# Patient Record
Sex: Male | Born: 1935 | Race: White | Hispanic: No | Marital: Married | Smoking: Former smoker
Health system: Southern US, Community
[De-identification: ages and names within clinical notes are randomized; demographics above are authoritative.]

## PROBLEM LIST (undated history)

## (undated) DIAGNOSIS — Z951 Presence of aortocoronary bypass graft: Secondary | ICD-10-CM

## (undated) DIAGNOSIS — D494 Neoplasm of unspecified behavior of bladder: Secondary | ICD-10-CM

## (undated) DIAGNOSIS — E785 Hyperlipidemia, unspecified: Secondary | ICD-10-CM

## (undated) DIAGNOSIS — I10 Essential (primary) hypertension: Secondary | ICD-10-CM

## (undated) DIAGNOSIS — I2510 Atherosclerotic heart disease of native coronary artery without angina pectoris: Secondary | ICD-10-CM

## (undated) HISTORY — PX: OTHER SURGICAL HISTORY: SHX169

---

## 1983-09-21 HISTORY — PX: CHOLECYSTECTOMY: SHX55

## 2000-01-07 ENCOUNTER — Encounter: Payer: Self-pay | Admitting: Urology

## 2000-01-07 ENCOUNTER — Encounter (INDEPENDENT_AMBULATORY_CARE_PROVIDER_SITE_OTHER): Payer: Self-pay | Admitting: Specialist

## 2000-01-07 ENCOUNTER — Ambulatory Visit (HOSPITAL_COMMUNITY): Admission: RE | Admit: 2000-01-07 | Discharge: 2000-01-07 | Payer: Self-pay | Admitting: Urology

## 2000-11-25 ENCOUNTER — Encounter (INDEPENDENT_AMBULATORY_CARE_PROVIDER_SITE_OTHER): Payer: Self-pay | Admitting: Specialist

## 2000-11-25 ENCOUNTER — Observation Stay (HOSPITAL_COMMUNITY): Admission: RE | Admit: 2000-11-25 | Discharge: 2000-11-26 | Payer: Self-pay | Admitting: Urology

## 2002-09-20 HISTORY — PX: CORONARY ARTERY BYPASS GRAFT: SHX141

## 2007-05-01 ENCOUNTER — Ambulatory Visit (HOSPITAL_BASED_OUTPATIENT_CLINIC_OR_DEPARTMENT_OTHER): Admission: RE | Admit: 2007-05-01 | Discharge: 2007-05-01 | Payer: Self-pay | Admitting: Urology

## 2007-05-01 ENCOUNTER — Encounter (INDEPENDENT_AMBULATORY_CARE_PROVIDER_SITE_OTHER): Payer: Self-pay | Admitting: Urology

## 2008-10-11 ENCOUNTER — Ambulatory Visit (HOSPITAL_BASED_OUTPATIENT_CLINIC_OR_DEPARTMENT_OTHER): Admission: RE | Admit: 2008-10-11 | Discharge: 2008-10-11 | Payer: Self-pay | Admitting: Urology

## 2008-10-11 ENCOUNTER — Encounter (INDEPENDENT_AMBULATORY_CARE_PROVIDER_SITE_OTHER): Payer: Self-pay | Admitting: Urology

## 2009-06-11 ENCOUNTER — Ambulatory Visit (HOSPITAL_COMMUNITY): Admission: RE | Admit: 2009-06-11 | Discharge: 2009-06-11 | Payer: Self-pay | Admitting: Urology

## 2009-11-27 ENCOUNTER — Ambulatory Visit (HOSPITAL_BASED_OUTPATIENT_CLINIC_OR_DEPARTMENT_OTHER): Admission: RE | Admit: 2009-11-27 | Discharge: 2009-11-28 | Payer: Self-pay | Admitting: Urology

## 2010-12-11 LAB — POCT I-STAT 4, (NA,K, GLUC, HGB,HCT)
Glucose, Bld: 107 mg/dL — ABNORMAL HIGH (ref 70–99)
HCT: 47 % (ref 39.0–52.0)
Hemoglobin: 16 g/dL (ref 13.0–17.0)
Potassium: 5.1 mEq/L (ref 3.5–5.1)
Sodium: 138 mEq/L (ref 135–145)

## 2011-01-04 LAB — POCT I-STAT 4, (NA,K, GLUC, HGB,HCT)
Glucose, Bld: 88 mg/dL (ref 70–99)
HCT: 45 % (ref 39.0–52.0)
Hemoglobin: 15.3 g/dL (ref 13.0–17.0)
Potassium: 4.6 mEq/L (ref 3.5–5.1)
Sodium: 140 mEq/L (ref 135–145)

## 2011-02-02 NOTE — Op Note (Signed)
NAME:  ELAI, Jose Gaines             ACCOUNT NO.:  [id number]   MEDICAL RECORD NO.:  [id number]          PATIENT TYPE:  AMB   LOCATION:  [facility]                         FACILITY:  [facility]   PHYSICIAN:  Sigmund Gaines. Patsi Sears, M.D.DATE OF BIRTH:  24-Apr-1927   DATE OF PROCEDURE:  10/11/2008  DATE OF DISCHARGE:                               OPERATIVE REPORT   PREOPERATIVE DIAGNOSIS:  Recurrent right bladder wall bladder cancer.   POSTOPERATIVE DIAGNOSIS:  Recurrent right bladder wall bladder cancer.   OPERATION:  Cystourethroscopy, cold cup bladder biopsy, holmium laser  vaporization of bladder tumor site, installation of mitomycin C.   SURGEON:  Sigmund Gaines. Patsi Sears, M.D.   ANESTHESIA:  General LMA.   PREPARATION:  After appropriate preanesthesia, the patient was brought  to the operating room, placed on the operating room table in the dorsal  supine position where general LMA anesthesia was introduced.  He was  then replaced in dorsal lithotomy position where the pubis was prepped  with Betadine solution and draped in the usual fashion.   REVIEW OF HISTORY:  Mr. [name] is a 77 year old male, history of  recurrent superficial bladder cancers.  He has been treated with BCG, as  well as mitomycin instillation in the past.  The patient also takes  multivitamins.  The patient has had a recent cystoscopy showing  recurrent bladder cancer.  He now is to undergo bladder biopsy and the  holmium laser vaporization of bladder tumor.   PROCEDURE:  Cystourethroscopy was accomplished, and shows a normal-  appearing bladder, except for a solitary right posterior bladder wall  bladder cancer.  There were multiple areas of whitish scar, representing  of previous areas of bladder cancer treatment.  There is no other  bladder cancer identified.  The prostate was subocclusive, with bilobar  BPH only.  The vera was normal.  There was no evidence of bladder stone  or diverticular formation.  There was  clear efflux of both orifices on a  normal trigone.  Cold cup bladder biopsy was then obtained, and sent to  laboratory for evaluation.  Holmium laser was used to paint the  remaining bladder cancer and its biopsy site.  The patient will undergo  installation of mitomycin C for 30-45 minutes postoperatively.  He was  awakened and given IV Toradol.  He had B  and O suppository at the  beginning of the procedure.  He was taken to the recovery room in good  condition.      Sigmund Gaines. Patsi Sears, M.D.  Electronically Signed     SIT/MEDQ  D:  10/11/2008  T:  10/11/2008  Job:  [id number]

## 2011-02-02 NOTE — Op Note (Signed)
NAMEDNAIEL, VOLLER             ACCOUNT NO.:  [id number]   MEDICAL RECORD NO.:  [id number]          PATIENT TYPE:  AMB   LOCATION:  [facility]                         FACILITY:  [facility]   PHYSICIAN:  Sigmund I. Patsi Sears, M.D.DATE OF BIRTH:  05-03-1927   DATE OF PROCEDURE:  05/01/2007  DATE OF DISCHARGE:  05/01/2007                               OPERATIVE REPORT   PREOPERATIVE DIAGNOSIS:  Recurrent left bladder wall bladder cancer.   POSTOPERATIVE DIAGNOSIS:  Recurrent left bladder wall bladder cancer.   OPERATION:  Cystourethroscopy, TUR left bladder wall bladder tumor,  random cold-cut bladder biopsy.   SURGEON:  Sigmund I. Patsi Sears, MD.   ANESTHESIA:  General LMA.   PREPARATION:  After appropriate pre-anesthesia, the patient was brought  to the operating room and placed on the operating table in the dorsal  supine position where general LMA anesthesia was introduced.  He was  then replaced in the dorsal lithotomy position where the pubis was  prepped with Betadine solution and draped in a the usual fashion.   PROCEDURE:  Cystourethroscopy was accomplished, and the left lateral  bladder wall bladder cancer was identified.  This was removed with cold-  cut bladder biopsy forceps, and the base cauterized.  Remaining bladder  biopsies were obtained randomly from around the bladder.  These areas  were cauterized as well.  The bladder was irrigated free of fluid, and  the patient was awakened and taken to the recovery room in good  condition.      Sigmund I. Patsi Sears, M.D.  Electronically Signed     SIT/MEDQ  D:  05/26/2007  T:  05/26/2007  Job:  [id number]

## 2011-02-02 NOTE — Op Note (Signed)
Jose Gaines, Jose Gaines             ACCOUNT NO.:  [id number]   MEDICAL RECORD NO.:  [id number]          PATIENT TYPE:  AMB   LOCATION:  [facility]                         FACILITY:  [facility]   PHYSICIAN:  Sigmund I. Patsi Sears, M.D.DATE OF BIRTH:  [birth year]   DATE OF PROCEDURE:  05/01/2007  DATE OF DISCHARGE:                               OPERATIVE REPORT   PREOPERATIVE DIAGNOSIS:  Recurrence of left bladder wall bladder cancer,  post BCG.   POSTOPERATIVE DIAGNOSIS:  Recurrence of left bladder wall bladder  cancer, post BCG.   OPERATION:  Cystuorethroscopy, transurethral cold cup biopsy of  recurrent bladder wall bladder tumor, random bladder biopsy,  cauterization of biopsy sites, and cauterization of bladder wall tumor,  Foley catheterization with insulation of chemotherapeutic agent  (mitomycin C).   SURGEON:  Sigmund I. Patsi Sears, M.D.   ANESTHESIA:  General LMA.   PREPARATION:  After preoperative preanesthesia, the patient was brought  to the operating room and placed on the operating table in the dorsal  supine position.  General LMA anesthesia was used.  He was then replaced  in the dorsal lithotomy position, where the penis was prepped with  Betadine solution and draped in the usual fashion.   REVIEW OF HISTORY:  [name] is a 77 year old male with a history of  multiple recurrent bladder cancers, status post BCG therapy over many  years.  He now has recurrence of his bladder wall bladder cancer.  No  history of upper tract disease.   PROCEDURE:  Cystourethroscopy reveals a left bladder wall bladder tumor,  appearing as moss growing on the left side of the bladder wall.  Cold  cup bladder biopsies are taken, as well as random bladder biopsies from  the right side of the bladder.  Clear efflux is seen from both orifices.  Cauterization is accomplished of the remaining tumor, as well as the  other biopsy site.  Foley catheter is placed.  Mitomycin 40 mg in 40 cc  of water  is placed for 30 minutes, and then the bladder will be  irrigated with 250 cc of normal saline in the recovery room.  Because of  the patient's past history of difficulty voiding after TUR of bladder  tumors, we will leave the Foley catheter overnight, place the patient on  Flomax, and then remove the catheter at home tomorrow morning.  He is  awakened and taken to the recovery room in good condition.  He is given  30 mg of IV Toradol.      Sigmund I. Patsi Sears, M.D.  Electronically Signed     SIT/MEDQ  D:  05/01/2007  T:  05/02/2007  Job:  [id number]

## 2011-02-02 NOTE — Op Note (Signed)
NAMEGABRIAN, HOQUE             ACCOUNT NO.:  [id number]   MEDICAL RECORD NO.:  [id number]          PATIENT TYPE:  AMB   LOCATION:  [facility]                         FACILITY:  [facility]   PHYSICIAN:  Sigmund I. Patsi Sears, M.D.DATE OF BIRTH:  Oct 10, 1926   DATE OF PROCEDURE:  DATE OF DISCHARGE:                               OPERATIVE REPORT   Re-dictated, 05/26/07.  # [id number].  ST      Sigmund I. Patsi Sears, M.D.  Electronically Signed     SIT/MEDQ  D:  05/01/2007  T:  05/02/2007  Job:  [id number]

## 2011-02-05 NOTE — Op Note (Signed)
[facility]  Patient:    Jose Gaines, PLAIN                    MRN: [id number] Proc. Date: 11/25/00 Adm. Date:  [id number] Attending:  Laqueta Jean                           Operative Report  PREOPERATIVE DIAGNOSES:  Recurrent bladder tumor.  POSTOPERATIVE DIAGNOSES:  Recurrent bladder tumor.  OPERATION PERFORMED:  Cystourethroscopy, transurethral resection bladder tumor (1 oclock) and cold cut bladder biopsies .  SURGEON:  Dr. Patsi Sears.  ANESTHESIA:  General (LMA).  PREPARATION:  After appropriate preanesthesia, the patient was brought to the operating room, placed on the operating table in dorsal supine position where general LMA anesthesia was introduced. He was then replaced in the dorsal lithotomy position where the pubis was prepped with Betadine solution and draped in the usual fashion.  DESCRIPTION OF PROCEDURE:  Cystourethroscopy revealed abnormality at the 1 oclock position, and this is transurethrally resected and sent separately for examination. Cold cut bladder biopsies of erythematous patches are also made, although these are thought secondary to old BCG therapy. Cauterization was accomplished of all areas of biopsies, and no bleeding was noted. A 20 Foley catheter was placed with ______ using a balloon, and the patient was then awakened and taken to the recovery room after B&O suppository was given. IV Toradol was given as well as Zofran. The patient was taken to the recovery room in good condition. DD:  11/25/00 TD:  11/26/00 Job: [id number]

## 2011-07-05 LAB — POCT I-STAT 4, (NA,K, GLUC, HGB,HCT)
Glucose, Bld: 91
HCT: 48
Hemoglobin: 16.3
Operator id: 114531
Potassium: 4.9
Sodium: 136

## 2012-01-12 ENCOUNTER — Other Ambulatory Visit: Payer: Self-pay | Admitting: Urology

## 2012-03-03 ENCOUNTER — Encounter (HOSPITAL_BASED_OUTPATIENT_CLINIC_OR_DEPARTMENT_OTHER): Payer: Self-pay | Admitting: *Deleted

## 2012-03-03 NOTE — Progress Notes (Signed)
NPO AFTER MN. ARRIVES AT 0715. NEEDS ISTAT AND EKG. WILL TAKE LIPITOR AM OF SURG. W/ SIP OF WATER.

## 2012-03-09 ENCOUNTER — Other Ambulatory Visit: Payer: Self-pay

## 2012-03-09 ENCOUNTER — Encounter (HOSPITAL_BASED_OUTPATIENT_CLINIC_OR_DEPARTMENT_OTHER): Payer: Self-pay | Admitting: Anesthesiology

## 2012-03-09 ENCOUNTER — Ambulatory Visit (HOSPITAL_BASED_OUTPATIENT_CLINIC_OR_DEPARTMENT_OTHER)
Admission: RE | Admit: 2012-03-09 | Discharge: 2012-03-09 | Disposition: A | Discharge status: Home / Self Care | Payer: Medicare Other | Source: Ambulatory Visit | Attending: Urology | Admitting: Urology

## 2012-03-09 ENCOUNTER — Encounter (HOSPITAL_BASED_OUTPATIENT_CLINIC_OR_DEPARTMENT_OTHER)
Admission: RE | Disposition: A | Discharge status: Home / Self Care | Payer: Self-pay | Source: Ambulatory Visit | Attending: Urology

## 2012-03-09 ENCOUNTER — Encounter (HOSPITAL_BASED_OUTPATIENT_CLINIC_OR_DEPARTMENT_OTHER): Payer: Self-pay | Admitting: *Deleted

## 2012-03-09 ENCOUNTER — Ambulatory Visit (HOSPITAL_BASED_OUTPATIENT_CLINIC_OR_DEPARTMENT_OTHER): Payer: Medicare Other | Admitting: Anesthesiology

## 2012-03-09 DIAGNOSIS — C679 Malignant neoplasm of bladder, unspecified: Secondary | ICD-10-CM

## 2012-03-09 DIAGNOSIS — Z7982 Long term (current) use of aspirin: Secondary | ICD-10-CM | POA: Insufficient documentation

## 2012-03-09 DIAGNOSIS — I10 Essential (primary) hypertension: Secondary | ICD-10-CM | POA: Insufficient documentation

## 2012-03-09 DIAGNOSIS — N3020 Other chronic cystitis without hematuria: Secondary | ICD-10-CM | POA: Insufficient documentation

## 2012-03-09 DIAGNOSIS — I2510 Atherosclerotic heart disease of native coronary artery without angina pectoris: Secondary | ICD-10-CM | POA: Insufficient documentation

## 2012-03-09 DIAGNOSIS — D303 Benign neoplasm of bladder: Secondary | ICD-10-CM | POA: Insufficient documentation

## 2012-03-09 DIAGNOSIS — E785 Hyperlipidemia, unspecified: Secondary | ICD-10-CM | POA: Insufficient documentation

## 2012-03-09 DIAGNOSIS — Z951 Presence of aortocoronary bypass graft: Secondary | ICD-10-CM | POA: Insufficient documentation

## 2012-03-09 DIAGNOSIS — Z79899 Other long term (current) drug therapy: Secondary | ICD-10-CM | POA: Insufficient documentation

## 2012-03-09 HISTORY — DX: Presence of aortocoronary bypass graft: Z95.1

## 2012-03-09 HISTORY — DX: Hyperlipidemia, unspecified: E78.5

## 2012-03-09 HISTORY — DX: Neoplasm of unspecified behavior of bladder: D49.4

## 2012-03-09 HISTORY — DX: Atherosclerotic heart disease of native coronary artery without angina pectoris: I25.10

## 2012-03-09 HISTORY — DX: Essential (primary) hypertension: I10

## 2012-03-09 HISTORY — PX: TRANSURETHRAL RESECTION OF BLADDER TUMOR: SHX2575

## 2012-03-09 LAB — POCT I-STAT 4, (NA,K, GLUC, HGB,HCT)
Glucose, Bld: 95 mg/dL (ref 70–99)
HCT: 43 % (ref 39.0–52.0)
Hemoglobin: 14.6 g/dL (ref 13.0–17.0)
Potassium: 4.2 mEq/L (ref 3.5–5.1)
Sodium: 142 mEq/L (ref 135–145)

## 2012-03-09 SURGERY — TURBT (TRANSURETHRAL RESECTION OF BLADDER TUMOR)
Anesthesia: General | Site: Bladder | Wound class: Clean Contaminated

## 2012-03-09 MED ORDER — LIDOCAINE HCL 2 % EX GEL
CUTANEOUS | Status: DC | PRN
  Administered 2012-03-09: 1

## 2012-03-09 MED ORDER — PROPOFOL 10 MG/ML IV EMUL
INTRAVENOUS | Status: DC | PRN
  Administered 2012-03-09: 200 mg via INTRAVENOUS

## 2012-03-09 MED ORDER — SODIUM CHLORIDE 0.45 % IV SOLN: INTRAVENOUS | Status: DC

## 2012-03-09 MED ORDER — HYDROCODONE-ACETAMINOPHEN 7.5-650 MG PO TABS: 1.0000 | ORAL_TABLET | Freq: Four times a day (QID) | ORAL | Status: AC | PRN

## 2012-03-09 MED ORDER — SODIUM CHLORIDE 0.9 % IR SOLN
Status: DC | PRN
  Administered 2012-03-09: 6000 mL via INTRAVESICAL

## 2012-03-09 MED ORDER — SODIUM CHLORIDE 0.9 % IV SOLN: 250.0000 mL | INTRAVENOUS | Status: DC | PRN

## 2012-03-09 MED ORDER — ONDANSETRON HCL 4 MG/2ML IJ SOLN
INTRAMUSCULAR | Status: DC | PRN
  Administered 2012-03-09: 4 mg via INTRAVENOUS

## 2012-03-09 MED ORDER — LACTATED RINGERS IV SOLN
INTRAVENOUS | Status: DC
  Administered 2012-03-09 (×2): via INTRAVENOUS

## 2012-03-09 MED ORDER — LIDOCAINE HCL (CARDIAC) 20 MG/ML IV SOLN
INTRAVENOUS | Status: DC | PRN
  Administered 2012-03-09: 75 mg via INTRAVENOUS

## 2012-03-09 MED ORDER — PHENAZOPYRIDINE HCL 200 MG PO TABS: 200.0000 mg | ORAL_TABLET | Freq: Three times a day (TID) | ORAL | Status: AC | PRN

## 2012-03-09 MED ORDER — HYOSCYAMINE SULFATE 0.125 MG SL SUBL
0.1250 mg | SUBLINGUAL_TABLET | SUBLINGUAL | Status: DC | PRN
  Administered 2012-03-09: 0.125 mg via SUBLINGUAL

## 2012-03-09 MED ORDER — ACETAMINOPHEN 325 MG PO TABS: 650.0000 mg | ORAL_TABLET | ORAL | Status: DC | PRN

## 2012-03-09 MED ORDER — BELLADONNA ALKALOIDS-OPIUM 16.2-60 MG RE SUPP
RECTAL | Status: DC | PRN
  Administered 2012-03-09: 1 via RECTAL

## 2012-03-09 MED ORDER — DEXAMETHASONE SODIUM PHOSPHATE 4 MG/ML IJ SOLN
INTRAMUSCULAR | Status: DC | PRN
  Administered 2012-03-09: 10 mg via INTRAVENOUS

## 2012-03-09 MED ORDER — ACETAMINOPHEN 10 MG/ML IV SOLN
INTRAVENOUS | Status: DC | PRN
  Administered 2012-03-09: 1000 mg via INTRAVENOUS

## 2012-03-09 MED ORDER — FENTANYL CITRATE 0.05 MG/ML IJ SOLN
INTRAMUSCULAR | Status: DC | PRN
  Administered 2012-03-09: 25 ug via INTRAVENOUS
  Administered 2012-03-09: 50 ug via INTRAVENOUS
  Administered 2012-03-09 (×3): 25 ug via INTRAVENOUS

## 2012-03-09 MED ORDER — ONDANSETRON HCL 4 MG/2ML IJ SOLN: 4.0000 mg | Freq: Four times a day (QID) | INTRAMUSCULAR | Status: DC | PRN

## 2012-03-09 MED ORDER — PHENAZOPYRIDINE HCL 200 MG PO TABS
200.0000 mg | ORAL_TABLET | Freq: Three times a day (TID) | ORAL | Status: DC
  Administered 2012-03-09: 200 mg via ORAL

## 2012-03-09 MED ORDER — FENTANYL CITRATE 0.05 MG/ML IJ SOLN
25.0000 ug | INTRAMUSCULAR | Status: DC | PRN
  Administered 2012-03-09 (×4): 25 ug via INTRAVENOUS

## 2012-03-09 MED ORDER — SODIUM CHLORIDE 0.9 % IJ SOLN: 3.0000 mL | Freq: Two times a day (BID) | INTRAMUSCULAR | Status: DC

## 2012-03-09 MED ORDER — CEFAZOLIN SODIUM 1-5 GM-% IV SOLN
1.0000 g | INTRAVENOUS | Status: AC
  Administered 2012-03-09: 2 g via INTRAVENOUS

## 2012-03-09 MED ORDER — ACETAMINOPHEN 650 MG RE SUPP: 650.0000 mg | RECTAL | Status: DC | PRN

## 2012-03-09 MED ORDER — EPHEDRINE SULFATE 50 MG/ML IJ SOLN
INTRAMUSCULAR | Status: DC | PRN
  Administered 2012-03-09: 10 mg via INTRAVENOUS

## 2012-03-09 MED ORDER — OXYCODONE HCL 5 MG PO TABS
5.0000 mg | ORAL_TABLET | ORAL | Status: DC | PRN
  Administered 2012-03-09: 10 mg via ORAL

## 2012-03-09 MED ORDER — SODIUM CHLORIDE 0.9 % IJ SOLN: 3.0000 mL | INTRAMUSCULAR | Status: DC | PRN

## 2012-03-09 MED ORDER — ASPIRIN 81 MG PO TABS
81.0000 mg | ORAL_TABLET | Freq: Every day | ORAL | Status: AC
Start: 2012-03-09 — End: ?

## 2012-03-09 MED ORDER — HYOSCYAMINE SULFATE CR 0.375 MG PO CP12: 0.3750 mg | ORAL_CAPSULE | Freq: Two times a day (BID) | ORAL | Status: DC | PRN

## 2012-03-09 MED ORDER — GLYCOPYRROLATE 0.2 MG/ML IJ SOLN
INTRAMUSCULAR | Status: DC | PRN
  Administered 2012-03-09: 0.2 mg via INTRAVENOUS

## 2012-03-09 SURGICAL SUPPLY — 29 items
BAG DRAIN URO-CYSTO SKYTR STRL (DRAIN) ×2 IMPLANT
BAG DRN ANRFLXCHMBR STRAP LEK (BAG)
BAG URINE DRAINAGE (UROLOGICAL SUPPLIES) IMPLANT
BAG URINE LEG 19OZ MD ST LTX (BAG) IMPLANT
BOOTIES KNEE HIGH SLOAN (MISCELLANEOUS) ×2 IMPLANT
CANISTER SUCT LVC 12 LTR MEDI- (MISCELLANEOUS) IMPLANT
CATH FOLEY 2WAY SLVR  5CC 20FR (CATHETERS)
CATH FOLEY 2WAY SLVR  5CC 22FR (CATHETERS)
CATH FOLEY 2WAY SLVR 5CC 20FR (CATHETERS) IMPLANT
CATH FOLEY 2WAY SLVR 5CC 22FR (CATHETERS) IMPLANT
CLOTH BEACON ORANGE TIMEOUT ST (SAFETY) ×2 IMPLANT
DRAPE CAMERA CLOSED 9X96 (DRAPES) ×2 IMPLANT
ELECT LOOP HF 24-28F (CUTTING LOOP) IMPLANT
ELECT LOOP HF 26F 30D .35MM (CUTTING LOOP) IMPLANT
ELECT REM PT RETURN 9FT ADLT (ELECTROSURGICAL) ×2
ELECTRODE REM PT RTRN 9FT ADLT (ELECTROSURGICAL) ×1 IMPLANT
GLOVE BIO SURGEON STRL SZ7 (GLOVE) ×2 IMPLANT
GLOVE BIO SURGEON STRL SZ7.5 (GLOVE) ×4 IMPLANT
GLOVE ECLIPSE 6.5 STRL STRAW (GLOVE) ×2 IMPLANT
GOWN STRL REIN XL XLG (GOWN DISPOSABLE) ×2 IMPLANT
GOWN SURGICAL LARGE (GOWNS) ×2 IMPLANT
GOWN XL W/COTTON TOWEL STD (GOWNS) ×2 IMPLANT
HOLDER FOLEY CATH W/STRAP (MISCELLANEOUS) IMPLANT
KIT ASPIRATION TUBING (SET/KITS/TRAYS/PACK) IMPLANT
LOOP CUTTING 24FR OLYMPUS (CUTTING LOOP) IMPLANT
PACK CYSTOSCOPY (CUSTOM PROCEDURE TRAY) ×2 IMPLANT
PLUG CATH AND CAP STER (CATHETERS) IMPLANT
SET ASPIRATION TUBING (TUBING) IMPLANT
SYRINGE IRR TOOMEY STRL 70CC (SYRINGE) IMPLANT

## 2012-03-09 NOTE — Interval H&P Note (Signed)
History and Physical Interval Note:  03/09/2012 8:58 AM  Estevan [facility] [employer]  has presented today for surgery, with the diagnosis of BLADDER TUMOR  The various methods of treatment have been discussed with the patient and family. After consideration of risks, benefits and other options for treatment, the patient has consented to  Procedure(s) (LRB): TRANSURETHRAL RESECTION OF BLADDER TUMOR (TURBT) (N/A) as a surgical intervention .  The patient's history has been reviewed, patient examined, no change in status, stable for surgery.  Gaines have reviewed the patients' chart and labs.  Questions were answered to the patient's satisfaction.     Jose Gaines

## 2012-03-09 NOTE — Anesthesia Procedure Notes (Signed)
Procedure Name: LMA Insertion Date/Time: 03/09/2012 8:59 AM Performed by: Fran Lowes Pre-anesthesia Checklist: Patient identified, Emergency Drugs available, Suction available and Patient being monitored Patient Re-evaluated:Patient Re-evaluated prior to inductionOxygen Delivery Method: Circle System Utilized Preoxygenation: Pre-oxygenation with 100% oxygen Intubation Type: IV induction Ventilation: Mask ventilation without difficulty LMA: LMA inserted LMA Size: 4.0 Number of attempts: 1 Airway Equipment and Method: bite block Placement Confirmation: positive ETCO2 Tube secured with: Tape Dental Injury: Teeth and Oropharynx as per pre-operative assessment  Comments: LMA inserted by Dr. Shireen Quan.

## 2012-03-09 NOTE — H&P (Signed)
Urology Admission H&P  Chief Complaint: Recurrent high grade non-invasive TCC bladder  History of Present Illness: 78 yo male post multiple TUR-BT of high-grade, non-invasive bladder tumors, post bladder wash chemotherapy with mitomycin and BCG, now with a solitary recurrence on routine surveillance cystoscopy.   Past Medical History  Diagnosis Date  . Hypertension   . Coronary artery disease CARDIOLOGIST- DR Raeanne Gathers IN [place], [place]    LAST VISIT 6 MONTHS AGO--   . Bladder tumor   . S/P CABG x 4   . Hyperlipidemia    Past Surgical History  Procedure Date  . Cholecystectomy 1985  . Coronary artery bypass graft 2004    X4  . Multiple bladder procedures  since 1975   . Cysto/ bladder bx's/ fulgeration of tumor's X5  LAST ONE 11-27-2009    2 OF THE SURG'S W/ INSTILLATION MITOMYCIN C    Home Medications:  Prescriptions prior to admission  Medication Sig Dispense Refill  . aspirin 81 MG tablet Take 81 mg by mouth daily.      Marland Kitchen atorvastatin (LIPITOR) 40 MG tablet Take 40 mg by mouth every morning.      Jennette Banker Sodium 30-100 MG CAPS Take by mouth daily.      . clopidogrel (PLAVIX) 75 MG tablet Take 75 mg by mouth daily.      Marland Kitchen lisinopril (PRINIVIL,ZESTRIL) 20 MG tablet Take 20 mg by mouth 2 (two) times daily.       Allergies:  Allergies  Allergen Reactions  . Sulfa Antibiotics Swelling and Rash    History reviewed. No pertinent family history. Social History:  reports that he has quit smoking. His smoking use included Pipe and Cigarettes. He quit after 20 years of use. He quit smokeless tobacco use about 33 years ago. He reports that he does not drink alcohol or use illicit drugs.  Review of Systems  Constitutional: Negative.  Negative for fever, chills and weight loss.  HENT: Negative.  Negative for hearing loss.   Eyes: Negative.  Negative for blurred vision.  Respiratory: Negative.   Gastrointestinal: Negative.   Genitourinary: Negative.  Negative for  hematuria.  Musculoskeletal: Negative.   Skin: Negative.  Negative for rash.  Neurological: Negative.  Negative for headaches.  Endo/Heme/Allergies: Negative.   Psychiatric/Behavioral: Negative.     Physical Exam:  Vital signs in last 24 hours: Temp:  [96.9 F (36.1 C)] 96.9 F (36.1 C) (06/20 0732) Pulse Rate:  [48] 48  (06/20 0732) Resp:  [20] 20  (06/20 0732) BP: (182)/(71) 182/71 mmHg (06/20 0732) SpO2:  [94 %] 94 % (06/20 0732) Physical Exam  Constitutional: He is oriented to person, place, and time. He appears well-developed and well-nourished.  HENT:  Head: Normocephalic.  Eyes: Pupils are equal, round, and reactive to light.  Neck: Normal range of motion.  Cardiovascular: Normal rate.   Respiratory: Effort normal.  GI: Soft. He exhibits no distension and no mass. There is no tenderness. There is no rebound and no guarding.  Genitourinary: Penis normal. No penile tenderness.  Musculoskeletal: Normal range of motion.  Neurological: He is alert and oriented to person, place, and time.  Skin: Skin is warm and dry.  Psychiatric: He has a normal mood and affect.    Laboratory Data:  Results for orders placed during the hospital encounter of 03/09/12 (from the past 24 hour(s))  POCT I-STAT 4, (NA,K, GLUC, HGB,HCT)     Status: Normal   Collection Time   03/09/12  7:48 AM  Component Value Range   Sodium 142  135 - 145 mEq/L   Potassium 4.2  3.5 - 5.1 mEq/L   Glucose, Bld 95  70 - 99 mg/dL   HCT 96.0  45.4 - 09.8 %   Hemoglobin 14.6  13.0 - 17.0 g/dL   No results found for this or any previous visit (from the past 240 hour(s)). Creatinine: No results found for this basename: CREATININE:7 in the last 168 hours Baseline Creatinine: 1.5  Impression/Assessment:  Recurrent Bladder tumor for TUR-BT  Plan:  TUR-BT today  Lu Paradise I 03/09/2012, 8:53 AM

## 2012-03-09 NOTE — Discharge Instructions (Signed)
Bladder Cancer Bladder cancer is an abnormal growth of tissue in your bladder. Your bladder is the balloon-like sack in your pelvis. It collects and stores urine that comes from the kidneys through the ureters. The ureters are 2 small muscular tubes that carry the urine from the kidneys to the bladder. When you feel the urge to pass your water (urinate), the urine in the bladder is passed out through the urethra. The bladder wall is made of layers. If cancer spreads into these layers and through the wall of the bladder, it becomes more difficult to treat.  CAUSES  The exact cause of bladder cancer is not known. There is a number of risk factors that can increase your chances of getting bladder cancer. They include:  Smoking. Smokers are more than twice as likely to get bladder cancer as nonsmokers.   Occupational exposures. Workers in a number of industries are at higher risk of bladder cancer. These include:   Rubber, Administrator, textile, and paint production workers.   Painters, hairdressers, Engineer, building services, Journalist, newspaper, and truck drivers.   The combination of industrial exposure and smoking further increases risk.   Race. Whites are about twice as likely to develop bladder cancer than African Americans and Hispanics. Asians have the lowest risk of bladder cancer.   Age. The risk of bladder cancer increases with age. Over half of the people with bladder cancer are over 43 years of age.   Sex. Men are 4 times more likely than women to get bladder cancer.   Chronic bladder inflammation. Urinary infections, kidney, and bladder stones and other causes of chronic bladder irritation are linked with bladder cancer. They do not necessarily cause bladder cancer.   Bladder cancer history. Once you have a bladder cancer, you are at higher risk of having another bladder cancer (even if the first cancer was completely treated).   Passed down by parents (heredity). Some bladder cancers seem to be inherited.    Chemotherapy and radiation therapy:   Cytoprotective medications may be used to protect the bladder from irritation and reduce the risk of bladder cancer.   People who receive radiation treatment to the pelvis are more likely to develop bladder cancer.   Arsenic. Arsenic in drinking water has been associated with an increased risk of bladder cancer.  In certain parts of the world (not the [place] a parasite called schistosomiasis is strongly associated with bladder cancer. This form of bladder cancer is not discussed here.  SYMPTOMS   Blood in the urine (hematuria) is the main symptom of bladder cancer. Blood may be present only in small amounts detectable under a microscope, or may cause the urine to actually appear bloody.   Irritation with urination.   Frequent bladder or urine infections.  DIAGNOSIS  Your caregiver may suspect bladder cancer based on your description of urinary symptomsor based on the finding of blood or infection in the urine (especially if this has recurred several times). A specialist will perform a test by inserting a narrow flexible or rigid tube into your bladder through your penis or urethra (cystoscopy). They will use light anaesthesia during this test. This test searches the lining of your bladder for tumors. A biopsy will be done to sample the tumor. The sample will be examined under a microscope to see if cancer is present.  If a cancer is present, it will then be staged to determine its severity and extent. It is important to know how deeply into the bladder wall the cancer has  grown, and whether the cancer has spread to any other parts of your body. Staging may require blood tests or special scans. TREATMENT  Once your cancer has been diagnosed and staged, you should discuss a treatment plan with your caregiver. Based on the stage of the cancer, one treatment or a combination of treatments may be recommended. The most common forms of treatment are:  Surgery.    Radiation Therapy.   Chemotherapy.   Immunotherapy.  When bladder cancer is caught early, the cancer can often be removed via cystoscopy, and no other treatment is necessary. Ongoing monitoring for recurrence is necessary.  HOME CARE INSTRUCTIONS   Take any prescription medications exactly as directed.   Keep all testing and specialist follow-up appointments.   If you have had a cystoscopy, you may have some blood in your urine after the test. This should clear up. You may also have some irritation with urination. If you were prescribed an antibiotic or pain medication, take it as directed.  SEEK MEDICAL CARE IF:   You develop increased pain, painful urination, or fever after a cystoscopy.   Your urine becomes bloody after initially improving.  SEEK IMMEDIATE MEDICAL CARE IF:  You develop a fever with chills with or without back or flank pain.  Document Released: 09/09/2003 Document Revised: 08/26/2011 Document Reviewed: 04/19/2008 ExitCare Patient Information 2012 [place], [facility]  CYSTOSCOPY HOME CARE INSTRUCTIONS  Activity: Rest for the remainder of the day.  Do not drive or operate equipment today.  You may resume normal activities in one to two days as instructed by your physician.   Meals: Drink plenty of liquids and eat light foods such as gelatin or soup this evening.  You may return to a normal meal plan tomorrow.  Return to Work: You may return to work in one to two days or as instructed by your physician.  Special Instructions / Symptoms: Call your physician if any of these symptoms occur:   -persistent or heavy bleeding  -bleeding which continues after first few urination  -large blood clots that are difficult to pass  -urine stream diminishes or stops completely  -fever equal to or higher than 101 degrees Farenheit.  -cloudy urine with a strong, foul odor  -severe pain  Females should always wipe from front to back after elimination.  You may feel some  burning pain when you urinate.  This should disappear with time.  Applying moist heat to the lower abdomen or a hot tub bath may help relieve the pain. \  Follow-Up / Date of Return Visit to Your Physician:  *** Call for an appointment to arrange follow-up.  Patient Signature:  ________________________________________________________  Nurse's Signature:  ________________________________________________________  Post Anesthesia Home Care Instructions  Activity: Get plenty of rest for the remainder of the day. A responsible adult should stay with you for 24 hours following the procedure.  For the next 24 hours, DO NOT: -Drive a car -Advertising copywriter -Drink alcoholic beverages -Take any medication unless instructed by your physician -Make any legal decisions or sign important papers.  Meals: Start with liquid foods such as gelatin or soup. Progress to regular foods as tolerated. Avoid greasy, spicy, heavy foods. If nausea and/or vomiting occur, drink only clear liquids until the nausea and/or vomiting subsides. Call your physician if vomiting continues.  Special Instructions/Symptoms: Your throat may feel dry or sore from the anesthesia or the breathing tube placed in your throat during surgery. If this causes discomfort, gargle with warm salt water. The  discomfort should disappear within 24 hours.  Marland Kitchen

## 2012-03-09 NOTE — Anesthesia Postprocedure Evaluation (Signed)
  Anesthesia Post-op Note  Patient: Jose Gaines  Procedure(s) Performed: Procedure(s) (LRB): TRANSURETHRAL RESECTION OF BLADDER TUMOR (TURBT) (N/A)  Patient Location: PACU  Anesthesia Type: General  Level of Consciousness: oriented and sedated  Airway and Oxygen Therapy: Patient Spontanous Breathing  Post-op Pain: mild  Post-op Assessment: Post-op Vital signs reviewed, Patient's Cardiovascular Status Stable, Respiratory Function Stable and Patent Airway  Post-op Vital Signs: stable  Complications: No apparent anesthesia complications

## 2012-03-09 NOTE — Anesthesia Preprocedure Evaluation (Addendum)
Anesthesia Evaluation  Patient identified by MRN, date of birth, ID band Patient awake  General Assessment Comment:Advanced yrs  Reviewed: Allergy & Precautions, H&P , NPO status , Patient's Chart, lab work & pertinent test results, reviewed documented beta blocker date and time   Airway Mallampati: II TM Distance: >3 FB Neck ROM: Full    Dental  (+) Teeth Intact and Poor Dentition   Pulmonary neg pulmonary ROS,  breath sounds clear to auscultation        Cardiovascular hypertension, + CAD and + CABG Rhythm:Regular Rate:Normal + Systolic murmurs CAD, s/p CABG 2004 Currently asymptomatic   Neuro/Psych negative neurological ROS  negative psych ROS   GI/Hepatic negative GI ROS, Neg liver ROS,   Endo/Other  negative endocrine ROS  Renal/GU negative Renal ROS   Bladder lesion    Musculoskeletal negative musculoskeletal ROS (+)   Abdominal   Peds negative pediatric ROS (+)  Hematology negative hematology ROS (+)   Anesthesia Other Findings   Reproductive/Obstetrics negative OB ROS                          Anesthesia Physical Anesthesia Plan  ASA: III  Anesthesia Plan: General   Post-op Pain Management:    Induction: Intravenous  Airway Management Planned: LMA  Additional Equipment:   Intra-op Plan:   Post-operative Plan: Extubation in OR  Informed Consent: I have reviewed the patients History and Physical, chart, labs and discussed the procedure including the risks, benefits and alternatives for the proposed anesthesia with the patient or authorized representative who has indicated his/her understanding and acceptance.   Dental advisory given  Plan Discussed with: CRNA and Surgeon  Anesthesia Plan Comments:         Anesthesia Quick Evaluation

## 2012-03-09 NOTE — Op Note (Signed)
Pre-operative diagnosis : Recurrent high grade non-invasive TCC bladder  Postoperative diagnosis:same  Operation:cystoscopy, cold cup bladder biopsy and Gyrus button fulguration of Left  lateral wall bladder tumor recurrence.  Surgeon:  Kathie Rhodes. Patsi Sears, MD  First assistant:none  Anesthesia:  general  Preparation: After appropriate preanesthesia, the patient was brought to the operating room, and placed on the operating table in the dorsal supine position. The arm band was rechecked. The patient was given general LMA anesthesia, and replaced in the dorsal lithotomy position, where the pubis was prepped with Betadine solution, and draped in usual fashion.  Review history: The patient is an 78 year old male, with a multiyear history of recurrent high-grade noninvasive TCC, status post multiple transurethral resections, as well as mitomycin-C treatment, and BCG therapy over the past 20 years. He returns today with left lateral wall recurrence.  Statement of  Likelihood of Success: Excellent. TIME-OUT observed.:  Procedure: Cystourethroscopy was accomplished, shows a 3 cm area of bladder tumor recurrence in the left lateral wall of the bladder, occurring as flattened in papillary areas. Remaining cystoscopy shows normal-appearing urethra, and subtotal occlusion of the prostate urethra from bilateral enlargement of the prostate lobes. There is no median lobe noted. There is mild trabeculation, but no evidence of cellule formation. There is no diverticular formation. There is no bladder stone formation.   Cold cup biopsies are accomplished, and, using the gyrus button electrode, the 3 cm area is cauterized. There is no bleeding noted. Bladder is drained of fluid, and I elected to not place a Foley catheter. Xylocaine gel was placed in the urethra. The patient was awakened, and taken to recovery room in excellent condition.

## 2012-03-09 NOTE — Transfer of Care (Signed)
Immediate Anesthesia Transfer of Care Note  Patient: Jose Gaines  Procedure(s) Performed: Procedure(s) (LRB): TRANSURETHRAL RESECTION OF BLADDER TUMOR (TURBT) (N/A)  Patient Location: Patient transported to PACU with oxygen via face mask at 4 Liters / Min  Anesthesia Type: General  Level of Consciousness: awake and alert   Airway & Oxygen Therapy: Patient Spontanous Breathing and Patient connected to face mask oxygen  Post-op Assessment: Report given to PACU RN and Post -op Vital signs reviewed and stable  Post vital signs: Reviewed and stable  Dentition: Teeth and oropharynx remain in pre-op condition  Complications: No apparent anesthesia complications

## 2012-03-13 ENCOUNTER — Encounter (HOSPITAL_BASED_OUTPATIENT_CLINIC_OR_DEPARTMENT_OTHER): Payer: Self-pay | Admitting: Urology

## 2012-12-11 ENCOUNTER — Other Ambulatory Visit: Payer: Self-pay | Admitting: Urology

## 2012-12-11 ENCOUNTER — Encounter (HOSPITAL_COMMUNITY): Payer: Self-pay | Admitting: Pharmacy Technician

## 2012-12-11 ENCOUNTER — Encounter (HOSPITAL_COMMUNITY): Payer: Self-pay | Admitting: *Deleted

## 2012-12-12 ENCOUNTER — Ambulatory Visit (HOSPITAL_COMMUNITY): Payer: Medicare Other

## 2012-12-12 ENCOUNTER — Encounter (HOSPITAL_COMMUNITY): Payer: Self-pay | Admitting: Anesthesiology

## 2012-12-12 ENCOUNTER — Ambulatory Visit (HOSPITAL_COMMUNITY): Payer: Medicare Other | Admitting: Anesthesiology

## 2012-12-12 ENCOUNTER — Ambulatory Visit (HOSPITAL_COMMUNITY)
Admission: RE | Admit: 2012-12-12 | Discharge: 2012-12-12 | Disposition: A | Discharge status: Home / Self Care | Payer: Medicare Other | Source: Ambulatory Visit | Attending: Urology | Admitting: Urology

## 2012-12-12 ENCOUNTER — Encounter (HOSPITAL_COMMUNITY): Payer: Self-pay | Admitting: *Deleted

## 2012-12-12 ENCOUNTER — Encounter (HOSPITAL_COMMUNITY)
Admission: RE | Disposition: A | Discharge status: Home / Self Care | Payer: Self-pay | Source: Ambulatory Visit | Attending: Urology

## 2012-12-12 DIAGNOSIS — IMO0002 Reserved for concepts with insufficient information to code with codable children: Secondary | ICD-10-CM | POA: Insufficient documentation

## 2012-12-12 DIAGNOSIS — Z882 Allergy status to sulfonamides status: Secondary | ICD-10-CM | POA: Insufficient documentation

## 2012-12-12 DIAGNOSIS — Z7902 Long term (current) use of antithrombotics/antiplatelets: Secondary | ICD-10-CM | POA: Insufficient documentation

## 2012-12-12 DIAGNOSIS — R011 Cardiac murmur, unspecified: Secondary | ICD-10-CM | POA: Insufficient documentation

## 2012-12-12 DIAGNOSIS — C679 Malignant neoplasm of bladder, unspecified: Secondary | ICD-10-CM | POA: Insufficient documentation

## 2012-12-12 DIAGNOSIS — Z7982 Long term (current) use of aspirin: Secondary | ICD-10-CM | POA: Insufficient documentation

## 2012-12-12 DIAGNOSIS — I10 Essential (primary) hypertension: Secondary | ICD-10-CM | POA: Insufficient documentation

## 2012-12-12 DIAGNOSIS — Z8673 Personal history of transient ischemic attack (TIA), and cerebral infarction without residual deficits: Secondary | ICD-10-CM | POA: Insufficient documentation

## 2012-12-12 DIAGNOSIS — Z79899 Other long term (current) drug therapy: Secondary | ICD-10-CM | POA: Insufficient documentation

## 2012-12-12 DIAGNOSIS — Z87891 Personal history of nicotine dependence: Secondary | ICD-10-CM | POA: Insufficient documentation

## 2012-12-12 DIAGNOSIS — I2510 Atherosclerotic heart disease of native coronary artery without angina pectoris: Secondary | ICD-10-CM | POA: Insufficient documentation

## 2012-12-12 DIAGNOSIS — M129 Arthropathy, unspecified: Secondary | ICD-10-CM | POA: Insufficient documentation

## 2012-12-12 HISTORY — PX: FULGURATION OF BLADDER TUMOR: SHX6261

## 2012-12-12 HISTORY — PX: CYSTOSCOPY WITH BIOPSY: SHX5122

## 2012-12-12 LAB — BASIC METABOLIC PANEL
BUN: 16 mg/dL (ref 6–23)
GFR calc Af Amer: 57 mL/min — ABNORMAL LOW (ref >90)
GFR calc non Af Amer: 49 mL/min — ABNORMAL LOW (ref >90)
Potassium: 4.2 mEq/L (ref 3.5–5.1)
Sodium: 137 mEq/L (ref 135–145)

## 2012-12-12 LAB — SURGICAL PCR SCREEN: MRSA, PCR: NEGATIVE

## 2012-12-12 LAB — CBC
HCT: 43.5 % (ref 39.0–52.0)
MCHC: 32.9 g/dL (ref 30.0–36.0)
Platelets: 194 10*3/uL (ref 150–400)
RDW: 14.2 % (ref 11.5–15.5)

## 2012-12-12 SURGERY — CYSTOSCOPY, WITH BIOPSY
Anesthesia: General | Wound class: Clean Contaminated

## 2012-12-12 MED ORDER — CIPROFLOXACIN IN D5W 400 MG/200ML IV SOLN
INTRAVENOUS | Status: AC
  Filled 2012-12-12: qty 200

## 2012-12-12 MED ORDER — FENTANYL CITRATE 0.05 MG/ML IJ SOLN
INTRAMUSCULAR | Status: DC | PRN
  Administered 2012-12-12: 25 ug via INTRAVENOUS
  Administered 2012-12-12: 50 ug via INTRAVENOUS

## 2012-12-12 MED ORDER — BELLADONNA ALKALOIDS-OPIUM 16.2-60 MG RE SUPP
RECTAL | Status: DC | PRN
  Administered 2012-12-12: 1 via RECTAL

## 2012-12-12 MED ORDER — PHENAZOPYRIDINE HCL 200 MG PO TABS: 200.0000 mg | ORAL_TABLET | Freq: Three times a day (TID) | ORAL | Status: DC | PRN

## 2012-12-12 MED ORDER — HYDROMORPHONE HCL PF 1 MG/ML IJ SOLN
INTRAMUSCULAR | Status: AC
  Filled 2012-12-12: qty 1

## 2012-12-12 MED ORDER — CIPROFLOXACIN IN D5W 400 MG/200ML IV SOLN
400.0000 mg | INTRAVENOUS | Status: AC
  Administered 2012-12-12: 400 mg via INTRAVENOUS

## 2012-12-12 MED ORDER — LACTATED RINGERS IV SOLN
INTRAVENOUS | Status: DC
  Administered 2012-12-12: 1000 mL via INTRAVENOUS
  Administered 2012-12-12: 1000 via INTRAVENOUS

## 2012-12-12 MED ORDER — SODIUM CHLORIDE 0.9 % IR SOLN
Status: DC | PRN
  Administered 2012-12-12 (×2): 3000 mL

## 2012-12-12 MED ORDER — PROMETHAZINE HCL 25 MG/ML IJ SOLN: 6.2500 mg | INTRAMUSCULAR | Status: DC | PRN

## 2012-12-12 MED ORDER — MUPIROCIN 2 % EX OINT
TOPICAL_OINTMENT | Freq: Two times a day (BID) | CUTANEOUS | Status: DC
  Filled 2012-12-12: qty 22

## 2012-12-12 MED ORDER — LACTATED RINGERS IV SOLN: INTRAVENOUS | Status: DC

## 2012-12-12 MED ORDER — PROPOFOL 10 MG/ML IV BOLUS
INTRAVENOUS | Status: DC | PRN
  Administered 2012-12-12: 140 mg via INTRAVENOUS

## 2012-12-12 MED ORDER — ACETAMINOPHEN 10 MG/ML IV SOLN
INTRAVENOUS | Status: AC
  Filled 2012-12-12: qty 100

## 2012-12-12 MED ORDER — BELLADONNA ALKALOIDS-OPIUM 16.2-60 MG RE SUPP
RECTAL | Status: AC
  Filled 2012-12-12: qty 1

## 2012-12-12 MED ORDER — LIDOCAINE HCL 2 % EX GEL
CUTANEOUS | Status: AC
  Filled 2012-12-12: qty 10

## 2012-12-12 MED ORDER — OXYCODONE-ACETAMINOPHEN 5-325 MG PO TABS: 1.0000 | ORAL_TABLET | ORAL | Status: DC | PRN

## 2012-12-12 MED ORDER — ACETAMINOPHEN 10 MG/ML IV SOLN
INTRAVENOUS | Status: DC | PRN
  Administered 2012-12-12: 1000 mg via INTRAVENOUS

## 2012-12-12 MED ORDER — HYDROMORPHONE HCL PF 1 MG/ML IJ SOLN
0.2500 mg | INTRAMUSCULAR | Status: DC | PRN
  Administered 2012-12-12 (×4): 0.5 mg via INTRAVENOUS

## 2012-12-12 MED ORDER — OXYCODONE-ACETAMINOPHEN 5-325 MG PO TABS
1.0000 | ORAL_TABLET | ORAL | Status: DC | PRN
  Administered 2012-12-12: 1 via ORAL
  Filled 2012-12-12: qty 1

## 2012-12-12 SURGICAL SUPPLY — 10 items
BAG URO CATCHER STRL LF (DRAPE) ×2 IMPLANT
CLOTH BEACON ORANGE TIMEOUT ST (SAFETY) ×2 IMPLANT
DRAPE CAMERA CLOSED 9X96 (DRAPES) ×2 IMPLANT
ELECT RESECT VAPORIZE 12D CBL (ELECTRODE) ×2 IMPLANT
GLOVE BIOGEL M STRL SZ7.5 (GLOVE) ×2 IMPLANT
GOWN STRL REIN XL XLG (GOWN DISPOSABLE) ×2 IMPLANT
MANIFOLD NEPTUNE II (INSTRUMENTS) ×2 IMPLANT
PACK CYSTO (CUSTOM PROCEDURE TRAY) ×2 IMPLANT
SCRUB PCMX 4 OZ (MISCELLANEOUS) ×2 IMPLANT
TUBING CONNECTING 10 (TUBING) ×2 IMPLANT

## 2012-12-12 NOTE — Interval H&P Note (Signed)
History and Physical Interval Note:  12/12/2012 1:11 PM  Jose Gaines  has presented today for surgery, with the diagnosis of BLADDER CANCER  The various methods of treatment have been discussed with the patient and family. After consideration of risks, benefits and other options for treatment, the patient has consented to  Procedure(s): CYSTOSCOPY WITH COLD CUP BLADDER BIOPSY (N/A) GYRUS BUTTON VAPORIZATION OF BLADDER TUMOR (N/A) as a surgical intervention .  The patient's history has been reviewed, patient examined, no change in status, stable for surgery.  I have reviewed the patient's chart and labs.  Questions were answered to the patient's satisfaction.     Jethro Bolus I

## 2012-12-12 NOTE — Progress Notes (Addendum)
RN called Dr Patsi Sears to ask about continuing plavix after bladder biopsy. Pt states he had a lot of bleeding after last biopsy when starting his plavix so soon. Pt also wants to question what he may do to help prevent so many clots in his urine after surgery. Writer pages Dr Patsi Sears.  1615  Dr Imelda Pillow RN calls back. Pt is to hold plavix until he returns to [place] on Friday 12/15/12. If he has clots in urine and has trouble passing urine, he will need to go to his primary MD or ER for a catheter.

## 2012-12-12 NOTE — H&P (Signed)
Active Problems Problems  1. Bladder Cancer 188.9 2. Gross Hematuria 599.71  History of Present Illness         79 yo male returns today for an 8 mo cystoscopy for hx of bladder cancer.  He is s/p cysto/bladder biopsy & fulgeration on 03/09/12 with pathology showing chronic inflammation only.  The patient has a history of recurrent bladder cancer and has had BCG treatments in the past.  The patient has had 9 bladder tumor resections.  Last TURBT with + pathology was on 11/27/09.  He is taking mega vitamins.      Past Medical History Problems  1. History of  Arthritis V13.4 2. History of  Coronary Artery Disease V12.59 3. Former Smoker V15.82 4. Former Smoker V15.82 5. History of  Heart Disease 429.9 6. History of  Hypertension 401.9 7. History of  Transient Ischemic Attack 435.9  Surgical History Problems  1. History of  CABG (CABG) 2. History of  Cholecystectomy 3. History of  Cystoscopy Bladder Tumor 596.9 4. History of  Cystoscopy With Fulguration Medium Lesion (2-5cm) 5. History of  Cystoscopy With Fulguration Small Lesion (5-43mm) 6. History of  Cystoscopy With Fulguration Small Lesion (5-94mm)  Current Meds 1. Advair Diskus 250-50 MCG/DOSE Inhalation Aerosol Powder Breath Activated; Therapy:  04Dec2012 to 2. Aspirin 81 MG Oral Tablet; Therapy: (Recorded:23Apr2013) to 3. Hydrocodone-Acetaminophen 7.5-650 MG Oral Tablet; Therapy: 20Jun2013 to 4. Levothyroxine Sodium 75 MCG Oral Tablet; Therapy: 13Apr2013 to 5. Lipitor 40 MG Oral Tablet; Therapy: 19Mar2012 to 6. Pantoprazole Sodium 40 MG Oral Tablet Delayed Release; Therapy: 14May2013 to 7. Plavix 75 MG Oral Tablet; Therapy: (Recorded:25Nov2009) to 8. Zestril 40 MG Oral Tablet; Therapy: (Recorded:11Jun2008) to  Allergies Medication  1. Sulfa Drugs  Social History Problems  1. Marital History - Currently Married 2. Occupation: ENGINEER  Review of Systems Genitourinary, constitutional, skin, eye, otolaryngeal,  hematologic/lymphatic, cardiovascular, pulmonary, endocrine, musculoskeletal, gastrointestinal, neurological and psychiatric system(s) were reviewed and pertinent findings if present are noted.  Genitourinary: hematuria.    Vitals Vital Signs [Data Includes: Last 1 Day]  24Mar2014 12:43PM  Blood Pressure: 166 / 73 Temperature: 97.7 F Heart Rate: 58  Physical Exam Constitutional: Well nourished and well developed . No acute distress.  ENT:. The ears and nose are normal in appearance.  Neck: The appearance of the neck is normal and no neck mass is present.  Pulmonary: No respiratory distress and normal respiratory rhythm and effort.  Cardiovascular: Heart rate and rhythm are normal . No peripheral edema.  Abdomen: The abdomen is soft and nontender. No masses are palpated. No CVA tenderness. No hernias are palpable. No hepatosplenomegaly noted.  Rectal: Rectal exam demonstrates normal sphincter tone, no tenderness and no masses. The prostate has no nodularity and is not tender. The left seminal vesicle is nonpalpable. The right seminal vesicle is nonpalpable. The perineum is normal on inspection.  Genitourinary: Examination of the penis demonstrates no discharge, no masses, no lesions and a normal meatus. The scrotum is without lesions. The right epididymis is palpably normal and non-tender. The left epididymis is palpably normal and non-tender. The right testis is non-tender and without masses. The left testis is non-tender and without masses.  Lymphatics: The femoral and inguinal nodes are not enlarged or tender.  Skin: Normal skin turgor, no visible rash and no visible skin lesions.  Neuro/Psych:. Mood and affect are appropriate.    Results/Data Urine [Data Includes: Last 1 Day]   24Mar2014  COLOR YELLOW   APPEARANCE CLEAR   SPECIFIC  GRAVITY <1.005   pH 6.0   GLUCOSE NEG mg/dL  BILIRUBIN NEG   KETONE NEG mg/dL  BLOOD SMALL   PROTEIN NEG mg/dL  UROBILINOGEN 0.2 mg/dL  NITRITE NEG    LEUKOCYTE ESTERASE NEG   SQUAMOUS EPITHELIAL/HPF RARE   WBC 0-2 WBC/hpf  RBC 3-6 RBC/hpf  BACTERIA NONE SEEN   CRYSTALS NONE SEEN   CASTS NONE SEEN    Procedure  Procedure: Cystoscopy  Chaperone Present: kim.  Indication: History of Urothelial Carcinoma.  Informed Consent: Risks, benefits, and potential adverse events were discussed and informed consent was obtained from the patient.  Prep: The patient was prepped with betadine.  Anesthesia:. Local anesthesia was administered intraurethrally with 2% lidocaine jelly.  Antibiotic prophylaxis: Ciprofloxacin.  Procedure Note:  Urethral meatus:. No abnormalities.  Anterior urethra: No abnormalities.  Prostatic urethra: No abnormalities . No intravesical median lobe was visualized.  Bladder: Visulization was clear. Examination of the bladder demonstrated trabeculation, but no clot within the bladder and no diverticulum no fistula, no erythematous mucosa, no ulcer, no edema and no cellules. Multiple tumors were identified in the bladder. A sessile tumor was seen in the bladder measuring approximately 2 cm in size. This tumor was located on the posterior aspect, at the base of the bladder. Another sessile tumor was seen in the bladder measuring approximately 1 cm in size. This tumor was located on the posterior aspect, at the base of the bladder.    Assessment Assessed  1. Bladder Cancer 188.9 2. Gross Hematuria 599.71   Two new bladder tumors at the posterior base of the bladder. Pt wants to have TURBT ASAP because he will be going to participate in his brother's funeral.   Plan Health Maintenance (V70.0)  1. UA With REFLEX  Done: 24Mar2014 12:08PM   TUR-BT ASAP   Signatures Electronically signed by : Jethro Bolus, M.D.; Dec 11 2012  5:30PM

## 2012-12-12 NOTE — Anesthesia Preprocedure Evaluation (Signed)
Anesthesia Evaluation  Patient identified by MRN, date of birth, ID band Patient awake  General Assessment Comment:Advanced yrs  Reviewed: Allergy & Precautions, H&P , NPO status , Patient's Chart, lab work & pertinent test results, reviewed documented beta blocker date and time   Airway Mallampati: II TM Distance: >3 FB Neck ROM: Full    Dental  (+) Teeth Intact and Poor Dentition   Pulmonary neg pulmonary ROS,  breath sounds clear to auscultation        Cardiovascular hypertension, + CAD and + CABG (CABG X4, 2004) Rhythm:Regular Rate:Normal + Systolic murmurs CAD, s/p CABG 2004 Currently asymptomatic   Neuro/Psych negative neurological ROS  negative psych ROS   GI/Hepatic negative GI ROS, Neg liver ROS,   Endo/Other  negative endocrine ROS  Renal/GU negative Renal ROS   Bladder lesion    Musculoskeletal negative musculoskeletal ROS (+)   Abdominal   Peds  Hematology negative hematology ROS (+)   Anesthesia Other Findings   Reproductive/Obstetrics negative OB ROS                           Anesthesia Physical Anesthesia Plan  ASA: III  Anesthesia Plan: General   Post-op Pain Management:    Induction: Intravenous  Airway Management Planned: LMA  Additional Equipment:   Intra-op Plan:   Post-operative Plan: Extubation in OR  Informed Consent: I have reviewed the patients History and Physical, chart, labs and discussed the procedure including the risks, benefits and alternatives for the proposed anesthesia with the patient or authorized representative who has indicated his/her understanding and acceptance.   Dental advisory given  Plan Discussed with: CRNA  Anesthesia Plan Comments:         Anesthesia Quick Evaluation

## 2012-12-12 NOTE — Transfer of Care (Signed)
Immediate Anesthesia Transfer of Care Note  Patient: Jose Gaines  Procedure(s) Performed: Procedure(s): CYSTOSCOPY WITH COLD CUP BLADDER BIOPSY (N/A) GYRUS BUTTON VAPORIZATION OF BLADDER TUMOR (N/A)  Patient Location: PACU  Anesthesia Type:General  Level of Consciousness: awake, sedated and patient cooperative  Airway & Oxygen Therapy: Patient Spontanous Breathing and Patient connected to face mask oxygen  Post-op Assessment: Report given to PACU RN and Post -op Vital signs reviewed and stable  Post vital signs: Reviewed and stable  Complications: No apparent anesthesia complications

## 2012-12-12 NOTE — Anesthesia Postprocedure Evaluation (Signed)
Anesthesia Post Note  Patient: Jose Gaines  Procedure(s) Performed: Procedure(s) (LRB): CYSTOSCOPY WITH COLD CUP BLADDER BIOPSY (N/A) GYRUS BUTTON VAPORIZATION OF BLADDER TUMOR (N/A)  Anesthesia type: General  Patient location: PACU  Post pain: Pain level controlled  Post assessment: Post-op Vital signs reviewed  Last Vitals:  Filed Vitals:   12/12/12 1430  BP: 150/60  Pulse: 46  Temp:   Resp: 10    Post vital signs: Reviewed  Level of consciousness: sedated  Complications: No apparent anesthesia complications

## 2012-12-12 NOTE — Op Note (Signed)
Pre-operative diagnosis :   Recurrent bladder cancer  Postoperative diagnosis:  Same  Operation:   Cystourethroscopy, cold cup bladder biopsy, gyrus button vaporization of recurrent bladder tumors  Surgeon:  S. Patsi Sears, MD  First assistant:  None  Anesthesia:  general  Preparation:  After appropriate preanesthesia, the patient was brought to the operating room, placed on the operating table in the dorsal supine position where general LMA anesthesia was introduced. He remained in this position where the pubis was prepped with Betadine solution, and draped in the usual fashion. The arm band was double checked.  Review history:   79 yo male returns today for an 8 mo cystoscopy for hx of bladder cancer. He is s/p cysto/bladder biopsy & fulgeration on 03/09/12 with pathology showing chronic inflammation only. The patient has a history of recurrent bladder cancer and has had BCG treatments in the past. The patient has had 9 bladder tumor resections. Last TURBT with + pathology was on 11/27/09. He is taking mega vitamins. Cysto shows 3 areas of localized sessile tumor recurrence at the posterior base.      Statement of  Likelihood of Success: Excellent. TIME-OUT observed.:  Procedure:  Cystourethroscopy was accomplished, revealing 3 areas of sessile bladder tumor recurrence in the posterior bladder base and right lateral bladder wall. Photodocumentation was accomplished, cold cup bladder biopsy was accomplished, and gyrus button vaporization of the tumor base and third tumor identified and were accomplished. No bleeding was noted. The bladder is drained of fluid, and the patient was awakened taken recovery room in good condition. He received 1 g of IV Tylenol as well as antibiotic.

## 2012-12-13 ENCOUNTER — Encounter (HOSPITAL_COMMUNITY): Payer: Self-pay | Admitting: Urology

## 2013-09-05 ENCOUNTER — Other Ambulatory Visit: Payer: Self-pay | Admitting: Urology

## 2013-09-06 MED ORDER — MITOMYCIN CHEMO FOR BLADDER INSTILLATION 40 MG: 40.0000 mg | Freq: Once | INTRAVENOUS | Status: DC

## 2013-10-16 ENCOUNTER — Encounter (HOSPITAL_COMMUNITY): Payer: Self-pay | Admitting: Pharmacy Technician

## 2013-10-17 ENCOUNTER — Encounter (HOSPITAL_COMMUNITY): Payer: Self-pay | Admitting: *Deleted

## 2013-10-18 NOTE — Progress Notes (Signed)
06/21/2013-H&P from [facility] (Dr. Barton Dubois) on chart 06/21/2013-H&P from [place] [facility]) on chart. 07/07/2011-Last office visit from [facility] and Stress test on chart.

## 2013-10-19 ENCOUNTER — Observation Stay (HOSPITAL_COMMUNITY)
Admission: RE | Admit: 2013-10-19 | Discharge: 2013-10-20 | Disposition: A | Discharge status: Home / Self Care | Payer: Medicare Other | Source: Ambulatory Visit | Attending: Urology | Admitting: Urology

## 2013-10-19 ENCOUNTER — Ambulatory Visit (HOSPITAL_COMMUNITY): Payer: Medicare Other | Admitting: Certified Registered Nurse Anesthetist

## 2013-10-19 ENCOUNTER — Encounter (HOSPITAL_COMMUNITY): Payer: Medicare Other | Admitting: Certified Registered Nurse Anesthetist

## 2013-10-19 ENCOUNTER — Encounter (HOSPITAL_COMMUNITY)
Admission: RE | Disposition: A | Discharge status: Home / Self Care | Payer: Self-pay | Source: Ambulatory Visit | Attending: Urology

## 2013-10-19 ENCOUNTER — Encounter (HOSPITAL_COMMUNITY): Payer: Self-pay | Admitting: *Deleted

## 2013-10-19 DIAGNOSIS — E785 Hyperlipidemia, unspecified: Secondary | ICD-10-CM | POA: Insufficient documentation

## 2013-10-19 DIAGNOSIS — Z7982 Long term (current) use of aspirin: Secondary | ICD-10-CM | POA: Insufficient documentation

## 2013-10-19 DIAGNOSIS — Z8673 Personal history of transient ischemic attack (TIA), and cerebral infarction without residual deficits: Secondary | ICD-10-CM | POA: Insufficient documentation

## 2013-10-19 DIAGNOSIS — Z87891 Personal history of nicotine dependence: Secondary | ICD-10-CM | POA: Insufficient documentation

## 2013-10-19 DIAGNOSIS — C679 Malignant neoplasm of bladder, unspecified: Principal | ICD-10-CM | POA: Insufficient documentation

## 2013-10-19 DIAGNOSIS — Z7902 Long term (current) use of antithrombotics/antiplatelets: Secondary | ICD-10-CM | POA: Insufficient documentation

## 2013-10-19 DIAGNOSIS — I10 Essential (primary) hypertension: Secondary | ICD-10-CM | POA: Insufficient documentation

## 2013-10-19 DIAGNOSIS — Z951 Presence of aortocoronary bypass graft: Secondary | ICD-10-CM | POA: Insufficient documentation

## 2013-10-19 DIAGNOSIS — Z79899 Other long term (current) drug therapy: Secondary | ICD-10-CM | POA: Insufficient documentation

## 2013-10-19 DIAGNOSIS — I2510 Atherosclerotic heart disease of native coronary artery without angina pectoris: Secondary | ICD-10-CM | POA: Insufficient documentation

## 2013-10-19 HISTORY — PX: CYSTOSCOPY WITH URETEROSCOPY AND STENT PLACEMENT: SHX6377

## 2013-10-19 LAB — BASIC METABOLIC PANEL
BUN: 14 mg/dL (ref 6–23)
CHLORIDE: 101 meq/L (ref 96–112)
CO2: 21 mEq/L (ref 19–32)
CREATININE: 1.19 mg/dL (ref 0.50–1.35)
Calcium: 8.9 mg/dL (ref 8.4–10.5)
GFR calc Af Amer: 63 mL/min — ABNORMAL LOW (ref >90)
GFR calc non Af Amer: 54 mL/min — ABNORMAL LOW (ref >90)
GLUCOSE: 94 mg/dL (ref 70–99)
Potassium: 3.9 mEq/L (ref 3.7–5.3)
Sodium: 138 mEq/L (ref 137–147)

## 2013-10-19 LAB — CBC
HEMATOCRIT: 38.4 % — AB (ref 39.0–52.0)
HEMOGLOBIN: 13.3 g/dL (ref 13.0–17.0)
MCH: 31.1 pg (ref 26.0–34.0)
MCHC: 34.6 g/dL (ref 30.0–36.0)
MCV: 89.9 fL (ref 78.0–100.0)
Platelets: 170 10*3/uL (ref 150–400)
RBC: 4.27 MIL/uL (ref 4.22–5.81)
RDW: 14 % (ref 11.5–15.5)
WBC: 9 10*3/uL (ref 4.0–10.5)

## 2013-10-19 SURGERY — CYSTOURETEROSCOPY, WITH STENT INSERTION
Anesthesia: General

## 2013-10-19 MED ORDER — EPHEDRINE SULFATE 50 MG/ML IJ SOLN
INTRAMUSCULAR | Status: AC
  Filled 2013-10-19: qty 1

## 2013-10-19 MED ORDER — PROPOFOL 10 MG/ML IV BOLUS
INTRAVENOUS | Status: AC
  Filled 2013-10-19: qty 20

## 2013-10-19 MED ORDER — PROPOFOL 10 MG/ML IV BOLUS
INTRAVENOUS | Status: DC | PRN
  Administered 2013-10-19: 140 mg via INTRAVENOUS

## 2013-10-19 MED ORDER — OXYCODONE HCL 5 MG PO TABS: 5.0000 mg | ORAL_TABLET | ORAL | Status: DC | PRN

## 2013-10-19 MED ORDER — PROMETHAZINE HCL 25 MG/ML IJ SOLN: 6.2500 mg | INTRAMUSCULAR | Status: DC | PRN

## 2013-10-19 MED ORDER — METHYLENE BLUE 1 % INJ SOLN
INTRAMUSCULAR | Status: DC | PRN
  Administered 2013-10-19: 1 mL via INTRAVENOUS

## 2013-10-19 MED ORDER — MITOMYCIN CHEMO FOR BLADDER INSTILLATION 40 MG
40.0000 mg | Freq: Once | INTRAVENOUS | Status: AC
  Administered 2013-10-19: 40 mg via INTRAVESICAL
  Filled 2013-10-19: qty 40

## 2013-10-19 MED ORDER — LIDOCAINE HCL (CARDIAC) 20 MG/ML IV SOLN
INTRAVENOUS | Status: AC
  Filled 2013-10-19: qty 5

## 2013-10-19 MED ORDER — GLYCOPYRROLATE 0.2 MG/ML IJ SOLN
INTRAMUSCULAR | Status: AC
  Filled 2013-10-19: qty 1

## 2013-10-19 MED ORDER — LIDOCAINE HCL (CARDIAC) 20 MG/ML IV SOLN
INTRAVENOUS | Status: DC | PRN
  Administered 2013-10-19: 100 mg via INTRAVENOUS

## 2013-10-19 MED ORDER — CEFAZOLIN SODIUM-DEXTROSE 2-3 GM-% IV SOLR
INTRAVENOUS | Status: AC
  Filled 2013-10-19: qty 50

## 2013-10-19 MED ORDER — LACTATED RINGERS IV SOLN
INTRAVENOUS | Status: DC
  Administered 2013-10-19: 08:00:00 via INTRAVENOUS

## 2013-10-19 MED ORDER — FENTANYL CITRATE 0.05 MG/ML IJ SOLN
25.0000 ug | INTRAMUSCULAR | Status: DC | PRN
  Administered 2013-10-19: 25 ug via INTRAVENOUS
  Administered 2013-10-19: 50 ug via INTRAVENOUS
  Administered 2013-10-19: 25 ug via INTRAVENOUS
  Administered 2013-10-19 (×2): 50 ug via INTRAVENOUS

## 2013-10-19 MED ORDER — FENTANYL CITRATE 0.05 MG/ML IJ SOLN
INTRAMUSCULAR | Status: AC
  Filled 2013-10-19: qty 2

## 2013-10-19 MED ORDER — OXYBUTYNIN CHLORIDE 5 MG PO TABS
5.0000 mg | ORAL_TABLET | Freq: Once | ORAL | Status: AC
  Administered 2013-10-19: 5 mg via ORAL
  Filled 2013-10-19 (×2): qty 1

## 2013-10-19 MED ORDER — FENTANYL CITRATE 0.05 MG/ML IJ SOLN
INTRAMUSCULAR | Status: DC | PRN
  Administered 2013-10-19: 25 ug via INTRAVENOUS
  Administered 2013-10-19: 50 ug via INTRAVENOUS
  Administered 2013-10-19: 25 ug via INTRAVENOUS
  Administered 2013-10-19 (×2): 50 ug via INTRAVENOUS

## 2013-10-19 MED ORDER — LISINOPRIL 10 MG PO TABS
10.0000 mg | ORAL_TABLET | Freq: Two times a day (BID) | ORAL | Status: DC
  Administered 2013-10-20 (×2): 10 mg via ORAL
  Filled 2013-10-19 (×3): qty 1

## 2013-10-19 MED ORDER — STERILE WATER FOR IRRIGATION IR SOLN
Status: DC | PRN
  Administered 2013-10-19: 3000 mL

## 2013-10-19 MED ORDER — ONDANSETRON HCL 4 MG PO TABS: 4.0000 mg | ORAL_TABLET | Freq: Three times a day (TID) | ORAL | Status: DC | PRN

## 2013-10-19 MED ORDER — ACETAMINOPHEN 10 MG/ML IV SOLN
1000.0000 mg | Freq: Four times a day (QID) | INTRAVENOUS | Status: DC
  Administered 2013-10-19: 1000 mg via INTRAVENOUS
  Filled 2013-10-19 (×4): qty 100

## 2013-10-19 MED ORDER — PHENAZOPYRIDINE HCL 200 MG PO TABS
200.0000 mg | ORAL_TABLET | Freq: Three times a day (TID) | ORAL | Status: AC | PRN
Start: 2013-10-19 — End: ?

## 2013-10-19 MED ORDER — ONDANSETRON HCL 4 MG/2ML IJ SOLN
INTRAMUSCULAR | Status: AC
  Filled 2013-10-19: qty 2

## 2013-10-19 MED ORDER — ONDANSETRON HCL 4 MG/2ML IJ SOLN
INTRAMUSCULAR | Status: DC | PRN
  Administered 2013-10-19: 4 mg via INTRAVENOUS

## 2013-10-19 MED ORDER — SODIUM CHLORIDE 0.9 % IJ SOLN
INTRAMUSCULAR | Status: AC
  Filled 2013-10-19: qty 10

## 2013-10-19 MED ORDER — ONDANSETRON HCL 4 MG/2ML IJ SOLN: 4.0000 mg | Freq: Once | INTRAMUSCULAR | Status: DC

## 2013-10-19 MED ORDER — KETOROLAC TROMETHAMINE 30 MG/ML IJ SOLN
INTRAMUSCULAR | Status: DC | PRN
  Administered 2013-10-19: 30 mg via INTRAVENOUS

## 2013-10-19 MED ORDER — TAMSULOSIN HCL 0.4 MG PO CAPS
0.4000 mg | ORAL_CAPSULE | Freq: Every day | ORAL | Status: DC
  Administered 2013-10-20: 0.4 mg via ORAL
  Filled 2013-10-19: qty 1

## 2013-10-19 MED ORDER — EPHEDRINE SULFATE 50 MG/ML IJ SOLN
INTRAMUSCULAR | Status: DC | PRN
  Administered 2013-10-19: 10 mg via INTRAVENOUS
  Administered 2013-10-19 (×2): 5 mg via INTRAVENOUS

## 2013-10-19 MED ORDER — HYDROMORPHONE HCL PF 1 MG/ML IJ SOLN
1.0000 mg | INTRAMUSCULAR | Status: AC | PRN
  Administered 2013-10-19: 1 mg via INTRAVENOUS
  Filled 2013-10-19: qty 1

## 2013-10-19 MED ORDER — ACETAMINOPHEN 500 MG PO TABS: 1000.0000 mg | ORAL_TABLET | Freq: Four times a day (QID) | ORAL | Status: DC | PRN

## 2013-10-19 MED ORDER — KETOROLAC TROMETHAMINE 30 MG/ML IJ SOLN: 15.0000 mg | Freq: Once | INTRAMUSCULAR | Status: DC | PRN

## 2013-10-19 MED ORDER — KETOROLAC TROMETHAMINE 30 MG/ML IJ SOLN
INTRAMUSCULAR | Status: AC
  Filled 2013-10-19: qty 1

## 2013-10-19 MED ORDER — GLYCOPYRROLATE 0.2 MG/ML IJ SOLN
INTRAMUSCULAR | Status: DC | PRN
  Administered 2013-10-19: 0.2 mg via INTRAVENOUS

## 2013-10-19 MED ORDER — HYDROCODONE-IBUPROFEN 5-200 MG PO TABS: 1.0000 | ORAL_TABLET | Freq: Four times a day (QID) | ORAL | Status: AC | PRN

## 2013-10-19 MED ORDER — METHYLENE BLUE 1 % INJ SOLN
INTRAMUSCULAR | Status: AC
  Filled 2013-10-19: qty 10

## 2013-10-19 MED ORDER — SODIUM CHLORIDE 0.9 % IR SOLN
Status: DC | PRN
  Administered 2013-10-19: 6000 mL via INTRAVESICAL

## 2013-10-19 MED ORDER — OXYCODONE-ACETAMINOPHEN 5-325 MG PO TABS
1.0000 | ORAL_TABLET | ORAL | Status: DC | PRN
  Administered 2013-10-19: 1 via ORAL

## 2013-10-19 MED ORDER — BELLADONNA ALKALOIDS-OPIUM 16.2-60 MG RE SUPP
RECTAL | Status: DC | PRN
  Administered 2013-10-19: 1 via RECTAL

## 2013-10-19 MED ORDER — BELLADONNA ALKALOIDS-OPIUM 16.2-60 MG RE SUPP
RECTAL | Status: AC
  Filled 2013-10-19: qty 1

## 2013-10-19 MED ORDER — OXYCODONE-ACETAMINOPHEN 5-325 MG PO TABS
ORAL_TABLET | ORAL | Status: AC
  Filled 2013-10-19: qty 1

## 2013-10-19 MED ORDER — CEFAZOLIN SODIUM-DEXTROSE 2-3 GM-% IV SOLR
2.0000 g | INTRAVENOUS | Status: AC
  Administered 2013-10-19: 2 g via INTRAVENOUS

## 2013-10-19 SURGICAL SUPPLY — 23 items
BAG URINE DRAINAGE (UROLOGICAL SUPPLIES) ×3 IMPLANT
BAG URO CATCHER STRL LF (DRAPE) ×3 IMPLANT
CATH FOLEY 2WAY SLVR  5CC 16FR (CATHETERS) ×2
CATH FOLEY 2WAY SLVR 5CC 16FR (CATHETERS) ×1 IMPLANT
CATH INTERMIT  6FR 70CM (CATHETERS) ×3 IMPLANT
CLOTH BEACON ORANGE TIMEOUT ST (SAFETY) ×3 IMPLANT
DRAPE CAMERA CLOSED 9X96 (DRAPES) ×3 IMPLANT
ELECT RESECT VAPORIZE 12D CBL (ELECTRODE) ×3 IMPLANT
GLOVE BIOGEL M STRL SZ7.5 (GLOVE) ×3 IMPLANT
GOWN STRL REUS W/TWL LRG LVL3 (GOWN DISPOSABLE) ×3 IMPLANT
GOWN STRL REUS W/TWL XL LVL3 (GOWN DISPOSABLE) ×3 IMPLANT
GUIDEWIRE STR DUAL SENSOR (WIRE) IMPLANT
KIT ASPIRATION TUBING (SET/KITS/TRAYS/PACK) ×3 IMPLANT
MANIFOLD NEPTUNE II (INSTRUMENTS) ×3 IMPLANT
NEEDLE HYPO 22GX1.5 SAFETY (NEEDLE) ×3 IMPLANT
NS IRRIG 1000ML POUR BTL (IV SOLUTION) IMPLANT
PACK CYSTO (CUSTOM PROCEDURE TRAY) ×3 IMPLANT
PAD TELFA 2X3 NADH STRL (GAUZE/BANDAGES/DRESSINGS) ×3 IMPLANT
PLUG CATH AND CAP STER (CATHETERS) ×3 IMPLANT
SCRUB PCMX 4 OZ (MISCELLANEOUS) IMPLANT
SYRINGE IRR TOOMEY STRL 70CC (SYRINGE) ×3 IMPLANT
TUBING CONNECTING 10 (TUBING) ×2 IMPLANT
TUBING CONNECTING 10' (TUBING) ×1

## 2013-10-19 NOTE — Progress Notes (Signed)
RN informed pt that Dr Gaynelle Arabian ordered foley to stay in over night. Patient to be instructed to remove foley tomorrow and be discharged tonight. Patient stated that he just wanted foley taken out now and he will go home after voiding.

## 2013-10-19 NOTE — Progress Notes (Addendum)
Patient has ambulated and ingested water; however, he is unable to void. RN asks him to come out of bathroom so that she may assess his bladder with bladder scanner. He wants to attempt to void just a little bit longer. Bladder scanned for 12 ml. RN palpates bladder which does not feel distended. Pt denies any pressure or discomfort with palpation. Paged Dr Louis Meckel. Order received for placing foley catheter and admitting patient over night.  2300  Report called to [place] on 5 W. Pt transferred to 5W via wheelchair.

## 2013-10-19 NOTE — Discharge Instructions (Signed)
Bladder Cancer Bladder cancer is an abnormal growth of tissue in your bladder. Your bladder is the balloon-like sac in your pelvis. It collects and stores urine that comes from the kidneys through the ureters. The bladder wall is made of layers. If cancer spreads into these layers and through the wall of the bladder, it becomes more difficult to treat.  There are four stages of bladder cancer:  Stage I. Cancer at this stage occurs in the bladder's inner lining but has not invaded the muscular bladder wall.  Stage II. At this stage, cancer has invaded the bladder wall but is still confined to the bladder.  Stage III. By this stage, the cancer cells have spread through the bladder wall to surrounding tissue. They may also have spread to the prostate in men or the uterus or vagina in women.  Stage IV. By this stage, cancer cells may have spread to the lymph nodes and other organs, such as your lungs, bones, or liver. RISK FACTORS Although the cause of bladder cancer is not known, the following risk factors can increase your chances of getting bladder cancer:   Smoking.   Occupational exposures, such as rubber, leather, textile, dyes, chemicals, and paint.   Being white.   Age.   Being male.   Having chronic bladder inflammation.   Having a bladder cancer history.   Having a family history of bladder cancer (heredity).   Having had chemotherapy or radiation therapy to the pelvis.   Being exposed to arsenic.  SYMPTOMS   Blood in the urine.   Pain with urination.   Frequent bladder or urine infections.  Increase in urgency and frequency of urination. DIAGNOSIS  Your health care provider may suspect bladder cancer based on your description of urinary symptoms or based on the finding of blood or infection in the urine (especially if this has recurred several times). Other tests or procedures that may be performed include:   A narrow tube being inserted into your  bladder through your urethra (cystoscopy) in order to view the lining of your bladder for tumors.   A biopsy to sample the tumor to see if cancer is present.  If cancer is present, it will then be staged to determine its severity and extent. It is important to know how deeply into the bladder wall the cancer has grown and whether the cancer has spread to any other parts of your body. Staging may require blood tests or special scans such as a CT scan, MRI, bone scan, or chest X-ray.  TREATMENT  Once your cancer has been diagnosed and staged, you should discuss a treatment plan with your health care provider. Based on the stage of the cancer, one treatment or a combination of treatments may be recommended. The most common forms of treatment are:   Surgery. Procedures that may be done include transurethral resection and cystectomy.  Radiation therapy. This is infrequently used to treat bladder cancer.   Chemotherapy. During this treatment, drugs are used to kill cancer cells.  Immunotherapy. This is usually administered directly into the bladder. HOME CARE INSTRUCTIONS  Only take over-the-counter or prescription medicines for pain, discomfort, or fever as directed by your health care provider.   Maintain a healthy diet.   Consider joining a support group. This may help you learn to cope with the stress of having bladder cancer.   Seek advice to help you manage treatment side effects.   Keep all follow-up appointments as directed by your health care  provider.   Inform your cancer specialist if you are admitted to the hospital.  [place] IF:  There is blood in your urine.  You have symptoms of a urinary tract infection. These include:  Tiredness.  Shakiness.  Weakness.  Muscle aches.  Abdominal pain.  Frequent and intense urge to urinate (in young women).  Burning feeling in the bladder or urethra during urination (in young women). SEEK IMMEDIATE MEDICAL  CARE IF:  You are unable to urinate. Document Released: 09/09/2003 Document Revised: 05/09/2013 Document Reviewed: 02/27/2013 [facility] Patient Information 2014 [place].

## 2013-10-19 NOTE — Preoperative (Signed)
Beta Blockers   Reason not to administer Beta Blockers:Not Applicable 

## 2013-10-19 NOTE — Interval H&P Note (Signed)
History and Physical Interval Note:  10/19/2013 8:19 AM  Jose Gaines  has presented today for surgery, with the diagnosis of Bladder Cancer  The various methods of treatment have been discussed with the patient and family. After consideration of risks, benefits and other options for treatment, the patient has consented to  Procedure(s): CYSTOSCOPY WITH URETEROSCOPY AND STENT PLACEMENT (N/A) as a surgical intervention .  The patient's history has been reviewed, patient examined, no change in status, stable for surgery.  I have reviewed the patient's chart and labs.  Questions were answered to the patient's satisfaction.     Carolan Clines I

## 2013-10-19 NOTE — Anesthesia Preprocedure Evaluation (Addendum)
Anesthesia Evaluation  Patient identified by MRN, date of birth, ID band Patient awake    Reviewed: Allergy & Precautions, H&P , NPO status , Patient's Chart, lab work & pertinent test results  Airway Mallampati: II TM Distance: >3 FB Neck ROM: Full    Dental no notable dental hx.    Pulmonary neg pulmonary ROS, former smoker,  breath sounds clear to auscultation  Pulmonary exam normal       Cardiovascular hypertension, Pt. on medications + CAD and + CABG Rhythm:Regular Rate:Normal     Neuro/Psych negative neurological ROS  negative psych ROS   GI/Hepatic negative GI ROS, Neg liver ROS,   Endo/Other  negative endocrine ROS  Renal/GU negative Renal ROS  negative genitourinary   Musculoskeletal negative musculoskeletal ROS (+)   Abdominal   Peds negative pediatric ROS (+)  Hematology negative hematology ROS (+)   Anesthesia Other Findings   Reproductive/Obstetrics negative OB ROS                          Anesthesia Physical Anesthesia Plan  ASA: III  Anesthesia Plan: General   Post-op Pain Management:    Induction: Intravenous  Airway Management Planned: LMA  Additional Equipment:   Intra-op Plan:   Post-operative Plan:   Informed Consent: I have reviewed the patients History and Physical, chart, labs and discussed the procedure including the risks, benefits and alternatives for the proposed anesthesia with the patient or authorized representative who has indicated his/her understanding and acceptance.   Dental advisory given  Plan Discussed with: CRNA and Surgeon  Anesthesia Plan Comments:         Anesthesia Quick Evaluation

## 2013-10-19 NOTE — Op Note (Signed)
Pre-operative diagnosis :  Recurrent bladder cancer   Postoperative diagnosis:  Same  Operation:  Cystourethroscopy, cold cup bladder biopsies, rollerball and cauterization of recurrent bladder base bladder cancer. Mitomycin-C per physician and recovery room.  Surgeon:  Chauncey Cruel. Gaynelle Arabian, MD  First assistant:  None  Anesthesia:  General LMA  Preparation:  After appropriate preanesthesia, the patient was brought to the operating room, placed in the upper table in the dorsal supine position where general LMA anesthesia was introduced. He was replaced in the dorsal lithotomy position with pubis was prepped with Betadine solution and draped in usual fashion. The arm band was double checked. The history was double checked. Note that the patient has a history of recurrent bladder tumor, last biopsied and fulgurated in March 19, 2012, status post BCG in the past. She's had a total of 9 tumor resections in the past. Into the wound, the patient and his last through [facility]. He is now for biopsy, and mitomycin C. placement.  Review history:  Problems  1. Bladder Cancer 188.9  History of Present Illness  80 yo male returns today for a 4 mo cystoscopy for hx of bladder cancer. His wife recently passed away in Mar 20, 2023. He is s/p cysto/TURBT on 12/12/12 with pathology showing low grade carcinoma. He had no po complications.  Hx of bladder cancer. He is s/p cysto/bladder biopsy & fulgeration on 03/09/12 with pathology showing chronic inflammation only. The patient has a history of recurrent bladder cancer and has had BCG treatments in the past. The patient has had 9 bladder tumor resections. Last TURBT with + pathology was on 11/27/09. He is taking mega vitamins.      Statement of  Likelihood of Success: Excellent. TIME-OUT observed.:  Procedure:  Cystourethroscopy Scarpa's, which shows at least 5 cm antegrade of bladder tumor along the right trigonal ridge, and bladder base. The bladder tumor is flat, and spreading in  patches. 3 of these patches are cold cup biopsied and sent to laboratory for evaluation, with a heart timeout, and tissue given to the surgical tach, for removal from the operative table.  Following this, the ball instrument was then used to cauterize the biopsy sites, as well as the remaining bladder tumor. Methylene blue was given at the beginning of procedure, but no blue urine was ever identified and the procedure.  The trigone was identified, and the ureteral orifices appeared within normal limits. A 16 Foley catheter was placed with 5 cc balloon. The patient received mitomycin C. in the recovery room. The patient received IV Toradol, was awakened and taken to recovery room in good condition.

## 2013-10-19 NOTE — Anesthesia Postprocedure Evaluation (Signed)
  Anesthesia Post-op Note  Patient: Jose Gaines  Procedure(s) Performed: Procedure(s) (LRB): CYSTOSCOPY, BLADDER BIOPSY OF BLADDER TUMOR GREATER THAN 5MM AND FULGURATION (N/A)  Patient Location: PACU  Anesthesia Type: General  Level of Consciousness: awake and alert   Airway and Oxygen Therapy: Patient Spontanous Breathing  Post-op Pain: mild  Post-op Assessment: Post-op Vital signs reviewed, Patient's Cardiovascular Status Stable, Respiratory Function Stable, Patent Airway and No signs of Nausea or vomiting  Last Vitals:  Filed Vitals:   10/19/13 1015  BP: 170/75  Pulse: 77  Temp:   Resp: 15    Post-op Vital Signs: stable   Complications: No apparent anesthesia complications

## 2013-10-19 NOTE — Transfer of Care (Signed)
Immediate Anesthesia Transfer of Care Note  Patient: Jose Gaines  Procedure(s) Performed: Procedure(s) (LRB): CYSTOSCOPY, BLADDER BIOPSY OF BLADDER TUMOR GREATER THAN 5MM AND FULGURATION (N/A)  Patient Location: PACU  Anesthesia Type: General  Level of Consciousness: sedated, patient cooperative and responds to stimulation  Airway & Oxygen Therapy: Patient Spontanous Breathing and Patient connected to face mask oxgen  Post-op Assessment: Report given to PACU RN and Post -op Vital signs reviewed and stable  Post vital signs: Reviewed and stable  Complications: No apparent anesthesia complications

## 2013-10-19 NOTE — Progress Notes (Signed)
Patient very firm that he does not want foley taken out due to bloody urine (no clots noted) Attempted to explain to patient that urine will most likely be bloody for a day or days post op and per doctors order to attempt void after removing catheter. He states this is more blood than he was expecting does not want to have foley remove due to fear he will not be able to void. Stated he wants foley overnight and possible need to stay in facility. Dr. Gaynelle Arabian paged.

## 2013-10-19 NOTE — H&P (Signed)
ason For Visit  4 mo cystoscopy   Active Problems Problems  1. Bladder Cancer 188.9  History of Present Illness           80 yo male returns today for a 4 mo cystoscopy for hx of bladder cancer.  His wife recently passed away in 03/02/2023.   He is s/p cysto/TURBT on 12/12/12 with pathology showing low grade carcinoma.  He had no po complications.      Hx of bladder cancer.  He is s/p cysto/bladder biopsy & fulgeration on 03/09/12 with pathology showing chronic inflammation only.  The patient has a history of recurrent bladder cancer and has had BCG treatments in the past.  The patient has had 9 bladder tumor resections.  Last TURBT with + pathology was on 11/27/09.  He is taking mega vitamins.   Past Medical History Problems  1. History of  Arthritis V13.4 2. History of  Coronary Artery Disease V12.59 3. Former Smoker V15.82 4. Former Smoker V15.82 5. History of  Heart Disease 429.9 6. History of  Hypertension 401.9 7. History of  Transient Ischemic Attack 435.9  Surgical History Problems  1. History of  CABG (CABG) 2. History of  Cholecystectomy 3. History of  Cystoscopy Bladder Tumor 596.9 4. History of  Cystoscopy With Biopsy 5. History of  Cystoscopy With Fulguration Medium Lesion (2-5cm) 6. History of  Cystoscopy With Fulguration Small Lesion (5-36mm) 7. History of  Cystoscopy With Fulguration Small Lesion (5-69mm)  Current Meds 1. Advair Diskus 250-50 MCG/DOSE Inhalation Aerosol Powder Breath Activated; Therapy:  [id number] to 2. Aspirin 81 MG Oral Tablet; Therapy: (Recorded:23Apr2013) to 3. Hydrocodone-Acetaminophen 7.5-650 MG TABS; Therapy: 20Jun2013 to 4. Levothyroxine Sodium 75 MCG Oral Tablet; Therapy: 13Apr2013 to 5. Lipitor 40 MG Oral Tablet; Therapy: [id number] to 6. Pantoprazole Sodium 40 MG Oral Tablet Delayed Release; Therapy: [id number] to 7. Plavix 75 MG Oral Tablet; Therapy: (Recorded:25Nov2009) to 8. Zestril 40 MG Oral Tablet; Therapy: (Recorded:11Jun2008)  to  Allergies Medication  1. Sulfa Drugs  Social History Problems  1. Marital History - Currently Married 2. Occupation: ENGINEER  Review of Systems Genitourinary, constitutional, skin, eye, otolaryngeal, hematologic/lymphatic, cardiovascular, pulmonary, endocrine, musculoskeletal, gastrointestinal, neurological and psychiatric system(s) were reviewed and pertinent findings if present are noted.    Vitals Vital Signs [Data Includes: Last 1 Day]  [id number] 09:31AM  Blood Pressure: 152 / 72 Temperature: 97 F Heart Rate: 50  Physical Exam Abdomen: The abdomen is flat and not distended. The abdomen is soft and nontender. No masses are palpated. No CVA tenderness. Bowel sounds are normal. No hernias are palpable. No hepatosplenomegaly noted.  Rectal: Rectal exam demonstrates normal sphincter tone, no tenderness and no masses. Estimated prostate size is 2+. Normal rectal tone, no rectal masses, prostate is smooth, symmetric and non-tender. The prostate has no nodularity and is not tender. The left seminal vesicle is nonpalpable. The right seminal vesicle is nonpalpable. The perineum is normal on inspection.  Genitourinary: Examination of the penis demonstrates no discharge, no masses, no lesions, a normal meatus, no meatal stenosis and no redness or irritation of the meatus. The penis is circumcised. The scrotum is without lesions. The right epididymis is palpably normal and non-tender. The left epididymis is palpably normal and non-tender. The right testis is non-tender and without masses. The left testis is non-tender and without masses.  Lymphatics: The femoral and inguinal nodes are not enlarged or tender.  Neuro/Psych:. Mood and affect are appropriate.    Results/Data Urine [Data Includes: Last 1  Day]   [id number]  COLOR YELLOW   APPEARANCE CLEAR   SPECIFIC GRAVITY <1.005   pH 5.0   GLUCOSE NEG mg/dL  BILIRUBIN NEG   KETONE NEG mg/dL  BLOOD SMALL   PROTEIN NEG mg/dL   UROBILINOGEN 0.2 mg/dL  NITRITE NEG   LEUKOCYTE ESTERASE NEG   SQUAMOUS EPITHELIAL/HPF RARE   WBC 0-2 WBC/hpf  RBC NONE SEEN RBC/hpf  BACTERIA NONE SEEN   CRYSTALS NONE SEEN   CASTS NONE SEEN    Procedure  Procedure: Cystoscopy  Chaperone Present: kim lewis.  Indication: History of Urothelial Carcinoma.  Informed Consent: Risks, benefits, and potential adverse events were discussed and informed consent was obtained from the patient.  Prep: The patient was prepped with betadine.  Anesthesia:. Local anesthesia was administered intraurethrally with 2% lidocaine jelly.  Antibiotic prophylaxis: Ciprofloxacin.  Procedure Note:  Anterior urethra: No abnormalities.  Prostatic urethra: No abnormalities.  Bladder: Visulization was clear. The ureteral orifices were in the normal anatomic position bilaterally. Examination of the bladder demonstrated no clot within the bladder, no trabeculation and no diverticulum no erythematous mucosa, no ulcer, no edema and no cellules. Multiple tumors were identified in the bladder. A papillary tumor was seen in the bladder. This tumor was located toward the midline, on the anterior aspect, near the dome of the bladder.    Assessment Assessed  1. Bladder Cancer 188.9   Multiple tiny erythematous areas, which appear to be evolving bladder tumors, at the 12 o'clock position. He needs to put his wife's affairs in order, and we will have him return from [place] in 6 months for cysto and bx of the lesions, and cauterization of the area at 12: o'clock. Will add Mitomycin-C.   Plan Bladder Cancer (188.9)  1. Follow-up Schedule Surgery Office  Follow-up  Requested for: 09Sep2014   Schedule cysto, cold cup biopsy of 12: o'clock abnormal areas, photodocumentation, and Mitomycin C placement.   Signatures Electronically signed by : Carolan Clines, M.D.; May 29 2013  2:50PM

## 2013-10-20 NOTE — Discharge Summary (Addendum)
Date of admission: 10/19/2013  Date of discharge: 10/20/2013  Admission diagnosis: Recurrent low-grade bladder cancer, Urinary retention, pelvic pain, gross hematuria  Discharge diagnosis: As above  Secondary diagnoses:  Patient Active Problem List   Diagnosis Date Noted  . Bladder cancer 10/19/2013    History and Physical: For full details, please see admission history and physical. Briefly, Jose Gaines is a 80 y.o. 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  He was scheduled for a TURBT and postoperative mitomycin C.In the recovery area his Foley catheter was removed and he was unable to void.  A Foley catheter was then placed and he had gross hematuria.  A decision was made to keep the patient overnight for observation.   Hospital Course: Patient tolerated the procedure well.  He was then transferred to the floor after an uneventful PACU stay.  His hospital course was uncomplicated.  On POD#1  he had met discharge criteria: was eating a regular diet, was up and ambulating independently,  pain was well controlled, was voiding without a catheter, and was ready to for discharge.   Laboratory values:   Recent Labs  10/19/13 0640  WBC 9.0  HGB 13.3  HCT 38.4*    Recent Labs  10/19/13 0640  NA 138  K 3.9  CL 101  CO2 21  GLUCOSE 94  BUN 14  CREATININE 1.19  CALCIUM 8.9   No results found for this basename: LABPT, INR,  in the last 72 hours No results found for this basename: LABURIN,  in the last 72 hours Results for orders placed during the hospital encounter of 12/12/12  SURGICAL PCR SCREEN     Status: Abnormal   Collection Time    12/12/12  9:15 AM      Result Value Range Status   MRSA, PCR NEGATIVE  NEGATIVE Final   Staphylococcus aureus POSITIVE (*) NEGATIVE Final   Comment:            The Xpert SA Assay (FDA     approved for NASAL specimens     in patients over 24 years of age),     is one component  of     a comprehensive surveillance     program.  Test performance has     been validated by [employer] for patients greater     than or equal to 31 year old.     It is not intended     to diagnose infection nor to     guide or monitor treatment.    Disposition: Home  Discharge instruction: The patient was instructed to be ambulatory but told to refrain from heavy lifting, strenuous activity, or driving.   Discharge medications:    Medication List         aspirin 81 MG tablet  Take 1 tablet (81 mg total) by mouth daily.     atorvastatin 40 MG tablet  Commonly known as:  LIPITOR  Take 40 mg by mouth every morning.     clopidogrel 75 MG tablet  Commonly known as:  PLAVIX  Take 75 mg by mouth daily with breakfast.     hydrocodone-ibuprofen 5-200 MG per tablet  Commonly known as:  VICOPROFEN  Take 1 tablet by mouth every 6 (six) hours as needed for pain. May increase to 2 tabs q 6 h.     lisinopril 20 MG tablet  Commonly known as:  PRINIVIL,ZESTRIL  Take 10 mg by mouth 2 (two) times daily.     multivitamin with minerals Tabs tablet  Take 1 tablet by mouth daily.     phenazopyridine 200 MG tablet  Commonly known as:  PYRIDIUM  Take 1 tablet (200 mg total) by mouth 3 (three) times daily as needed for pain.        Followup:      Follow-up Information   Follow up with Carolan Clines I, MD. Schedule an appointment as soon as possible for a visit in 3 months.   Specialty:  Urology   Contact information:   [facility]  [place] [address] [phone]        Cc Dr. Carolan Clines, M.D., [facility]

## 2013-10-20 NOTE — Progress Notes (Signed)
Urology Inpatient Progress Report  ID: Patient was in admitted after a TURBT and postoperative mitomycin C.  His catheter was removed in the PACU and he was unable to void.  He was having significant pelvic pain.  Intv/Subj: No acute events overnight. Patient is without complaint.  Objective: Vital: Filed Vitals:   10/19/13 2210 10/19/13 2314 10/20/13 0212 10/20/13 0539  BP: 163/65 161/62 103/60 105/59  Pulse: 55 60 76 76  Temp: 98.1 F (36.7 C) 98.8 F (37.1 C) 98.2 F (36.8 C)   TempSrc: Oral Oral Oral Oral  Resp: 18 18 16 16   Height:      Weight:  80.51 kg (177 lb 7.9 oz)    SpO2:  100% 94% 97%   I/Os: I/O last 3 completed shifts: In: 2157.5 [P.O.:780; I.V.:1277.5; IV Piggyback:100] Out: 62 [Urine:825]  Past Medical History  Diagnosis Date  . Hypertension   . Coronary artery disease CARDIOLOGIST- DR Minette Brine IN [place], [place]    LAST VISIT 6 MONTHS AGO--   . Bladder tumor   . S/P CABG x 4   . Hyperlipidemia    Current Facility-Administered Medications  Medication Dose Route Frequency Provider Last Rate Last Dose  . acetaminophen (TYLENOL) tablet 1,000 mg  1,000 mg Oral Q6H PRN Ardis Hughs, MD      . lisinopril (PRINIVIL,ZESTRIL) tablet 10 mg  10 mg Oral BID Ardis Hughs, MD   10 mg at 10/20/13 0912  . ondansetron (ZOFRAN) injection 4 mg  4 mg Intravenous Once Ailene Rud, MD      . ondansetron [facility]) tablet 4 mg  4 mg Oral Q8H PRN Ardis Hughs, MD      . oxyCODONE (Oxy IR/ROXICODONE) immediate release tablet 5-10 mg  5-10 mg Oral Q4H PRN Ardis Hughs, MD      . tamsulosin [facility]) capsule 0.4 mg  0.4 mg Oral Daily Ardis Hughs, MD   0.4 mg at 10/20/13 7858    Physical Exam:  General: Patient is in no apparent distress Lungs: Normal respiratory effort, chest expands symmetrically. Foley catheter is draining dark urine consistent with old blood.  Lab Results:  Recent Labs  10/19/13 0640  WBC 9.0  HGB 13.3  HCT 38.4*     Recent Labs  10/19/13 0640  NA 138  K 3.9  CL 101  CO2 21  GLUCOSE 94  BUN 14  CREATININE 1.19  CALCIUM 8.9   No results found for this basename: LABPT, INR,  in the last 72 hours No results found for this basename: LABURIN,  in the last 72 hours Results for orders placed during the hospital encounter of 12/12/12  SURGICAL PCR SCREEN     Status: Abnormal   Collection Time    12/12/12  9:15 AM      Result Value Range Status   MRSA, PCR NEGATIVE  NEGATIVE Final   Staphylococcus aureus POSITIVE (*) NEGATIVE Final   Comment:            The Xpert SA Assay (FDA     approved for NASAL specimens     in patients over 83 years of age),     is one component of     a comprehensive surveillance     program.  Test performance has     been validated by [employer] for patients greater     than or equal to 43 year old.     It is not intended  to diagnose infection nor to     guide or monitor treatment.    Studies/Results:  Assessment: 1 Day Post-Op Status post TURBT and postoperative mitomycin C postoperative urinary retention and clot retention.  Plan: The patient would like a trial of voiding this morning.  If he is successful he will then be discharged and plans to return to [facility] later today.  If unsuccessful, the patient will have a catheter replaced and then discharged to a hotel room where he will then follow up with Dr. Gaynelle Arabian on Monday morning.    Louis Meckel W 10/20/2013, 9:45 AM

## 2013-10-20 NOTE — Progress Notes (Signed)
Utilization Review Completed.   Louella Medaglia, RN, BSN Nurse Case Manager  

## 2013-10-22 ENCOUNTER — Encounter (HOSPITAL_COMMUNITY): Payer: Self-pay | Admitting: Urology

## 2014-10-15 ENCOUNTER — Other Ambulatory Visit: Payer: Self-pay | Admitting: Urology

## 2014-10-22 ENCOUNTER — Encounter (HOSPITAL_COMMUNITY): Payer: Self-pay | Admitting: *Deleted

## 2014-10-23 NOTE — Anesthesia Preprocedure Evaluation (Addendum)
Anesthesia Evaluation  Patient identified by MRN, date of birth, ID band Patient awake    Reviewed: Allergy & Precautions, H&P , NPO status , Patient's Chart, lab work & pertinent test results  Airway Mallampati: II  TM Distance: >3 FB Neck ROM: Full    Dental no notable dental hx. (+) Missing, Dental Advisory Given, Partial Upper Several missing upper front teeth:   Pulmonary neg pulmonary ROS, former smoker,  breath sounds clear to auscultation  Pulmonary exam normal       Cardiovascular hypertension, Pt. on medications + CAD and + CABG Rhythm:Regular Rate:Normal     Neuro/Psych negative neurological ROS  negative psych ROS   GI/Hepatic negative GI ROS, Neg liver ROS,   Endo/Other  negative endocrine ROS  Renal/GU negative Renal ROS  negative genitourinary   Musculoskeletal negative musculoskeletal ROS (+)   Abdominal   Peds negative pediatric ROS (+)  Hematology negative hematology ROS (+)   Anesthesia Other Findings   Reproductive/Obstetrics negative OB ROS                            Anesthesia Physical Anesthesia Plan  ASA: III  Anesthesia Plan: General   Post-op Pain Management:    Induction: Intravenous  Airway Management Planned: LMA  Additional Equipment:   Intra-op Plan:   Post-operative Plan:   Informed Consent:   Plan Discussed with: Surgeon  Anesthesia Plan Comments:         Anesthesia Quick Evaluation

## 2014-10-25 ENCOUNTER — Encounter (HOSPITAL_COMMUNITY): Payer: Self-pay | Admitting: *Deleted

## 2014-10-25 ENCOUNTER — Encounter (HOSPITAL_COMMUNITY)
Admission: RE | Disposition: A | Discharge status: Home / Self Care | Payer: Self-pay | Source: Ambulatory Visit | Attending: Urology

## 2014-10-25 ENCOUNTER — Ambulatory Visit (HOSPITAL_COMMUNITY): Payer: Medicare Other

## 2014-10-25 ENCOUNTER — Ambulatory Visit (HOSPITAL_COMMUNITY)
Admission: RE | Admit: 2014-10-25 | Discharge: 2014-10-25 | Disposition: A | Discharge status: Home / Self Care | Payer: Medicare Other | Source: Ambulatory Visit | Attending: Urology | Admitting: Urology

## 2014-10-25 ENCOUNTER — Ambulatory Visit (HOSPITAL_COMMUNITY): Payer: Medicare Other | Admitting: Anesthesiology

## 2014-10-25 DIAGNOSIS — Z79899 Other long term (current) drug therapy: Secondary | ICD-10-CM | POA: Insufficient documentation

## 2014-10-25 DIAGNOSIS — Z8673 Personal history of transient ischemic attack (TIA), and cerebral infarction without residual deficits: Secondary | ICD-10-CM | POA: Diagnosis not present

## 2014-10-25 DIAGNOSIS — Z9889 Other specified postprocedural states: Secondary | ICD-10-CM | POA: Diagnosis not present

## 2014-10-25 DIAGNOSIS — Z951 Presence of aortocoronary bypass graft: Secondary | ICD-10-CM | POA: Diagnosis not present

## 2014-10-25 DIAGNOSIS — Z01811 Encounter for preprocedural respiratory examination: Secondary | ICD-10-CM

## 2014-10-25 DIAGNOSIS — Z7982 Long term (current) use of aspirin: Secondary | ICD-10-CM | POA: Diagnosis not present

## 2014-10-25 DIAGNOSIS — Z7902 Long term (current) use of antithrombotics/antiplatelets: Secondary | ICD-10-CM | POA: Insufficient documentation

## 2014-10-25 DIAGNOSIS — Z87891 Personal history of nicotine dependence: Secondary | ICD-10-CM | POA: Diagnosis not present

## 2014-10-25 DIAGNOSIS — M1990 Unspecified osteoarthritis, unspecified site: Secondary | ICD-10-CM | POA: Diagnosis not present

## 2014-10-25 DIAGNOSIS — C671 Malignant neoplasm of dome of bladder: Secondary | ICD-10-CM | POA: Insufficient documentation

## 2014-10-25 DIAGNOSIS — I10 Essential (primary) hypertension: Secondary | ICD-10-CM | POA: Insufficient documentation

## 2014-10-25 DIAGNOSIS — I2510 Atherosclerotic heart disease of native coronary artery without angina pectoris: Secondary | ICD-10-CM | POA: Insufficient documentation

## 2014-10-25 DIAGNOSIS — C675 Malignant neoplasm of bladder neck: Secondary | ICD-10-CM | POA: Diagnosis not present

## 2014-10-25 HISTORY — PX: TRANSURETHRAL RESECTION OF BLADDER TUMOR WITH GYRUS (TURBT-GYRUS): SHX6458

## 2014-10-25 LAB — BASIC METABOLIC PANEL
Anion gap: 6 (ref 5–15)
BUN: 17 mg/dL (ref 6–23)
CALCIUM: 8.9 mg/dL (ref 8.4–10.5)
CO2: 26 mmol/L (ref 19–32)
Chloride: 105 mmol/L (ref 96–112)
Creatinine, Ser: 1.27 mg/dL (ref 0.50–1.35)
GFR, EST AFRICAN AMERICAN: 58 mL/min — AB (ref >90)
GFR, EST NON AFRICAN AMERICAN: 50 mL/min — AB (ref >90)
Glucose, Bld: 100 mg/dL — ABNORMAL HIGH (ref 70–99)
POTASSIUM: 4.7 mmol/L (ref 3.5–5.1)
SODIUM: 137 mmol/L (ref 135–145)

## 2014-10-25 LAB — CBC
HEMATOCRIT: 37.8 % — AB (ref 39.0–52.0)
HEMOGLOBIN: 12.7 g/dL — AB (ref 13.0–17.0)
MCH: 31.2 pg (ref 26.0–34.0)
MCHC: 33.6 g/dL (ref 30.0–36.0)
MCV: 92.9 fL (ref 78.0–100.0)
PLATELETS: 193 10*3/uL (ref 150–400)
RBC: 4.07 MIL/uL — ABNORMAL LOW (ref 4.22–5.81)
RDW: 14.1 % (ref 11.5–15.5)
WBC: 8.6 10*3/uL (ref 4.0–10.5)

## 2014-10-25 SURGERY — TRANSURETHRAL RESECTION OF BLADDER TUMOR WITH GYRUS (TURBT-GYRUS)
Anesthesia: General

## 2014-10-25 MED ORDER — FENTANYL CITRATE 0.05 MG/ML IJ SOLN
INTRAMUSCULAR | Status: AC
  Filled 2014-10-25: qty 2

## 2014-10-25 MED ORDER — FENTANYL CITRATE 0.05 MG/ML IJ SOLN
INTRAMUSCULAR | Status: DC | PRN
  Administered 2014-10-25 (×2): 25 ug via INTRAVENOUS
  Administered 2014-10-25: 50 ug via INTRAVENOUS

## 2014-10-25 MED ORDER — OXYCODONE-ACETAMINOPHEN 5-325 MG PO TABS: 1.0000 | ORAL_TABLET | ORAL | Status: AC | PRN

## 2014-10-25 MED ORDER — PROPOFOL 10 MG/ML IV BOLUS
INTRAVENOUS | Status: AC
  Filled 2014-10-25: qty 20

## 2014-10-25 MED ORDER — HYDROMORPHONE HCL 1 MG/ML IJ SOLN
0.2500 mg | INTRAMUSCULAR | Status: DC | PRN
  Administered 2014-10-25: 0.5 mg via INTRAVENOUS

## 2014-10-25 MED ORDER — 0.9 % SODIUM CHLORIDE (POUR BTL) OPTIME
TOPICAL | Status: DC | PRN
  Administered 2014-10-25: 1000 mL

## 2014-10-25 MED ORDER — SILODOSIN 8 MG PO CAPS: 8.0000 mg | ORAL_CAPSULE | Freq: Every day | ORAL | Status: AC

## 2014-10-25 MED ORDER — LIDOCAINE HCL 1 % IJ SOLN
INTRAMUSCULAR | Status: DC | PRN
  Administered 2014-10-25: 50 mg via INTRADERMAL

## 2014-10-25 MED ORDER — LACTATED RINGERS IV SOLN
INTRAVENOUS | Status: DC
  Administered 2014-10-25: 1000 mL via INTRAVENOUS

## 2014-10-25 MED ORDER — CEFAZOLIN SODIUM-DEXTROSE 2-3 GM-% IV SOLR
INTRAVENOUS | Status: AC
  Filled 2014-10-25: qty 50

## 2014-10-25 MED ORDER — BELLADONNA ALKALOIDS-OPIUM 16.2-60 MG RE SUPP
RECTAL | Status: DC | PRN
  Administered 2014-10-25: 1 via RECTAL

## 2014-10-25 MED ORDER — BELLADONNA ALKALOIDS-OPIUM 16.2-60 MG RE SUPP
1.0000 | Freq: Once | RECTAL | Status: AC
Start: 2014-10-25 — End: 2014-10-25
  Administered 2014-10-25: 1 via RECTAL

## 2014-10-25 MED ORDER — LACTATED RINGERS IV SOLN
INTRAVENOUS | Status: DC | PRN
  Administered 2014-10-25: 07:00:00 via INTRAVENOUS

## 2014-10-25 MED ORDER — CEFAZOLIN SODIUM-DEXTROSE 2-3 GM-% IV SOLR
2.0000 g | INTRAVENOUS | Status: AC
  Administered 2014-10-25: 2 g via INTRAVENOUS

## 2014-10-25 MED ORDER — BELLADONNA ALKALOIDS-OPIUM 16.2-60 MG RE SUPP
RECTAL | Status: AC
  Filled 2014-10-25: qty 1

## 2014-10-25 MED ORDER — FENTANYL CITRATE 0.05 MG/ML IJ SOLN
INTRAMUSCULAR | Status: DC
Start: 2014-10-25 — End: 2014-10-25
  Filled 2014-10-25: qty 2

## 2014-10-25 MED ORDER — LIDOCAINE HCL (CARDIAC) 20 MG/ML IV SOLN
INTRAVENOUS | Status: AC
  Filled 2014-10-25: qty 5

## 2014-10-25 MED ORDER — PHENAZOPYRIDINE HCL 200 MG PO TABS: 200.0000 mg | ORAL_TABLET | Freq: Three times a day (TID) | ORAL | Status: AC | PRN

## 2014-10-25 MED ORDER — FENTANYL CITRATE 0.05 MG/ML IJ SOLN
25.0000 ug | INTRAMUSCULAR | Status: DC | PRN
  Administered 2014-10-25 (×3): 50 ug via INTRAVENOUS

## 2014-10-25 MED ORDER — OXYCODONE-ACETAMINOPHEN 5-325 MG PO TABS
1.0000 | ORAL_TABLET | ORAL | Status: DC | PRN
  Administered 2014-10-25: 1 via ORAL
  Filled 2014-10-25: qty 1

## 2014-10-25 MED ORDER — MITOMYCIN CHEMO FOR BLADDER INSTILLATION 40 MG
40.0000 mg | Freq: Once | INTRAVENOUS | Status: AC
  Administered 2014-10-25: 40 mg via INTRAVESICAL
  Filled 2014-10-25: qty 40

## 2014-10-25 MED ORDER — HYDROMORPHONE HCL 1 MG/ML IJ SOLN
INTRAMUSCULAR | Status: AC
  Filled 2014-10-25: qty 1

## 2014-10-25 MED ORDER — SODIUM CHLORIDE 0.9 % IR SOLN
Status: DC | PRN
  Administered 2014-10-25: 6000 mL

## 2014-10-25 MED ORDER — ACETAMINOPHEN 10 MG/ML IV SOLN
1000.0000 mg | Freq: Once | INTRAVENOUS | Status: AC
  Administered 2014-10-25: 1000 mg via INTRAVENOUS
  Filled 2014-10-25: qty 100

## 2014-10-25 MED ORDER — PROPOFOL 10 MG/ML IV BOLUS
INTRAVENOUS | Status: DC | PRN
  Administered 2014-10-25: 110 mg via INTRAVENOUS

## 2014-10-25 MED ORDER — KETOROLAC TROMETHAMINE 30 MG/ML IJ SOLN
INTRAMUSCULAR | Status: DC | PRN
  Administered 2014-10-25: 30 mg via INTRAVENOUS

## 2014-10-25 MED ORDER — PHENAZOPYRIDINE HCL 200 MG PO TABS
200.0000 mg | ORAL_TABLET | Freq: Once | ORAL | Status: AC
  Administered 2014-10-25: 200 mg via ORAL
  Filled 2014-10-25: qty 1

## 2014-10-25 MED ORDER — KETOROLAC TROMETHAMINE 30 MG/ML IJ SOLN
INTRAMUSCULAR | Status: AC
  Filled 2014-10-25: qty 1

## 2014-10-25 SURGICAL SUPPLY — 23 items
BAG URINE DRAINAGE (UROLOGICAL SUPPLIES) IMPLANT
BAG URO CATCHER STRL LF (DRAPE) ×3 IMPLANT
CATH FOLEY 2WAY SLVR  5CC 18FR (CATHETERS) ×2
CATH FOLEY 2WAY SLVR 5CC 18FR (CATHETERS) ×1 IMPLANT
ELECT LOOP 22F BIPOLAR SML (ELECTROSURGICAL) ×3
ELECTRODE LOOP 22F BIPOLAR SML (ELECTROSURGICAL) ×1 IMPLANT
GLOVE BIOGEL M STRL SZ7.5 (GLOVE) ×3 IMPLANT
GOWN STRL REUS W/TWL LRG LVL3 (GOWN DISPOSABLE) ×3 IMPLANT
GOWN STRL REUS W/TWL XL LVL3 (GOWN DISPOSABLE) ×3 IMPLANT
HOLDER FOLEY CATH W/STRAP (MISCELLANEOUS) IMPLANT
IV NS IRRIG 3000ML ARTHROMATIC (IV SOLUTION) IMPLANT
KIT ASPIRATION TUBING (SET/KITS/TRAYS/PACK) ×3 IMPLANT
LOOP CUT BIPOLAR 24F LRG (ELECTROSURGICAL) IMPLANT
LOOPS RESECTOSCOPE DISP (ELECTROSURGICAL) IMPLANT
MANIFOLD NEPTUNE II (INSTRUMENTS) ×3 IMPLANT
MARKER SKIN DUAL TIP RULER LAB (MISCELLANEOUS) ×3 IMPLANT
NS IRRIG 1000ML POUR BTL (IV SOLUTION) IMPLANT
PACK CYSTO (CUSTOM PROCEDURE TRAY) ×3 IMPLANT
PLUG CATH AND CAP STER (CATHETERS) ×3 IMPLANT
SCRUB PCMX 4 OZ (MISCELLANEOUS) IMPLANT
SYRINGE IRR TOOMEY STRL 70CC (SYRINGE) IMPLANT
TUBING CONNECTING 10 (TUBING) ×2 IMPLANT
TUBING CONNECTING 10' (TUBING) ×1

## 2014-10-25 NOTE — Progress Notes (Signed)
Chemo precautions taken with catheter irrigation

## 2014-10-25 NOTE — Anesthesia Postprocedure Evaluation (Signed)
  Anesthesia Post-op Note  Patient: Jose Gaines  Procedure(s) Performed: Procedure(s) (LRB): Transurethral Resection Biopsy Bladder Tumor,Cauterization Bladder neck and bladder tumor, Cauterization of bladder dome and bladder tumor (N/A)  Patient Location: PACU  Anesthesia Type: General  Level of Consciousness: awake and alert   Airway and Oxygen Therapy: Patient Spontanous Breathing  Post-op Pain: mild  Post-op Assessment: Post-op Vital signs reviewed, Patient's Cardiovascular Status Stable, Respiratory Function Stable, Patent Airway and No signs of Nausea or vomiting  Last Vitals:  Filed Vitals:   10/25/14 0833  BP: 161/72  Pulse: 60  Temp: 36.6 C  Resp: 16    Post-op Vital Signs: stable   Complications: No apparent anesthesia complications

## 2014-10-25 NOTE — H&P (Signed)
Reason For Visit Yearly cystoscopy   Active Problems Problems  1. 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 He is s/p cysto/TURBT/Mitomycin C instillation on 10/19/13 with pathology showing low grade carcinoma. His wife recently passed away in 04-03-13.  He is s/p cysto/TURBT on 12/12/12 with pathology showing low grade carcinoma. He had no po complications.      Hx of bladder cancer. He is s/p cysto/bladder biopsy & fulgeration on 03/09/12 with pathology showing chronic inflammation only. The patient has a history of recurrent bladder cancer and has had BCG treatments in the past. The patient has had 9 bladder tumor resections. Last TURBT with + pathology was on 11/27/09. He is taking mega vitamins.   Past Medical History Problems  1. History of Arthritis 2. History of Coronary Artery Disease 3. Former smoker [phone]) 4. Former smoker [phone]) 5. History of cardiac disorder (Z86.79) 6. History of hypertension (Z86.79) 7. History of transient cerebral ischemia (D32.67)  Surgical History Problems  1. History of Bladder Injection Of Cancer Treatment 2. History of CABG (CABG) 3. History of Cholecystectomy 4. History of Cystoscopy Bladder Tumor 5. History of Cystoscopy With Biopsy 6. History of Cystoscopy With Biopsy 7. History of Cystoscopy With Fulguration Medium Lesion (2-5cm) 8. History of Cystoscopy With Fulguration Small Lesion (5-70mm) 9. History of Cystoscopy With Fulguration Small Lesion (5-62mm)  Current Meds 1. Advair Diskus 250-50 MCG/DOSE Inhalation Aerosol Powder Breath Activated;  Therapy: [id number] to Recorded 2. Aspirin 81 MG Oral Tablet;  Therapy: (Recorded:23Apr2013) to Recorded 3. Hydrocodone-Acetaminophen 7.5-650 MG TABS;  Therapy: 20Jun2013 to Recorded 4.  Levothyroxine Sodium 75 MCG Oral Tablet;  Therapy: 13Apr2013 to Recorded 5. Lipitor 40 MG Oral Tablet;  Therapy: [id number] to Recorded 6. Pantoprazole Sodium 40 MG Oral Tablet Delayed Release;  Therapy: [id number] to Recorded 7. Plavix 75 MG Oral Tablet;  Therapy: (Recorded:25Nov2009) to Recorded 8. Uribel 118 MG Oral Capsule; TAKE 1 CAPSULE 3 times daily;  Therapy: [id number] to (Last Rx:16Feb2015)  Requested for: [id number] Ordered 9. Zestril 40 MG Oral Tablet;  Therapy: (Recorded:11Jun2008) to Recorded  Allergies Medication  1. Sulfa Drugs  Social History Problems  1. Marital History - Currently Married 2. Occupation:   ENGINEER  Review of Systems Genitourinary, constitutional, skin, eye, otolaryngeal, hematologic/lymphatic, cardiovascular, pulmonary, endocrine, musculoskeletal, gastrointestinal, neurological and psychiatric system(s) were reviewed and pertinent findings if present are noted and are otherwise negative.    Vitals Vital Signs [Data Includes: Last 1 Day]  Recorded: 25Jan2016 01:48PM  Weight: 172 lb  BMI Calculated: 25.4 BSA Calculated: 1.94 Blood Pressure: 160 / 81 Temperature: 97.6 F Heart Rate: 71  Physical Exam Constitutional: Well nourished and well developed . No acute distress.  ENT:. The ears and nose are normal in appearance.  Neck: The appearance of the neck is normal and no neck mass is present.  Pulmonary: No respiratory distress and normal respiratory rhythm and effort.  Cardiovascular: Heart rate and rhythm are normal . No peripheral edema.  Abdomen: The abdomen is flat and not distended. The abdomen is soft and nontender. No masses are palpated. No CVA tenderness. Bowel sounds are normal. No hernias are palpable. No hepatosplenomegaly noted.  Genitourinary: Examination of the penis demonstrates no discharge, no masses, no lesions, a normal meatus, no meatal stenosis and no redness or irritation of the  meatus. The penis is circumcised. The  scrotum is without lesions. The right epididymis is palpably normal and non-tender. The left epididymis is palpably normal and non-tender. The right testis is non-tender and without masses. The left testis is non-tender and without masses.  Lymphatics: The femoral and inguinal nodes are not enlarged or tender.  Skin: Normal skin turgor, no visible rash and no visible skin lesions.  Neuro/Psych:. Mood and affect are appropriate.    Procedure  Procedure: Cystoscopy  Chaperone Present: kim lewis.  Indication: History of Urothelial Carcinoma.  Informed Consent: Risks, benefits, and potential adverse events were discussed and informed consent was obtained from the patient.  Prep: The patient was prepped with betadine.  Anesthesia:. Local anesthesia was administered intraurethrally with 2% lidocaine jelly.  Antibiotic prophylaxis: Ciprofloxacin.  Procedure Note:  Urethral meatus:. No abnormalities.  Anterior urethra: No abnormalities.  Prostatic urethra: No abnormalities.  Bladder: Visulization was clear. The ureteral orifices were in the normal anatomic position bilaterally. Examination of the bladder demonstrated no trabeculation and no diverticulum no fistula. A solitary tumor was visualized in the bladder. A tumor was seen in the bladder measuring approximately 2 cm in size. This tumor was located on the right side, at the neck of the bladder. Another sessile tumor was seen in the bladder measuring approximately 5 cm in size. This tumor was located on the posterior aspect, near the dome of the bladder.    Assessment Assessed  1. 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   Plan Schedule TU-BT, and cold-cup, cauterize bladder dome, and post-op chemoRx.   Signatures Electronically signed by : Carolan Clines, M.D.; Oct 14 2014  9:22PM  EST

## 2014-10-25 NOTE — Op Note (Signed)
Pre-operative diagnosis :  Recurrent Low -grade TCC bladder at bladder dome ( 2cm)  and bladder neck ( 2cm). 4cm aggregate.  Postoperative diagnosis:  same  Operation:  Cystoscopy, TUR-BT of bladder neck bladder tumor, and TUR cauterization of bladder dome tumor.  Surgeon:  Chauncey Cruel. Gaynelle Arabian, MD  First assistant:  none  Anesthesia:  General LMA  Preparation:  After appropriate preanesthesia, the patient was brought to the operative room, placed on the operating table in the dorsal supine position where general LMA anesthesia was introduced. He was then replaced in the dorsal lithotomy position with the penis was prepped with Betadine solution and draped in usual fashion. The history was reviewed. The armband was double checked. Review history:  transitional cell carcinoma of bladder (C67.9)  Assessed By: Carolan Clines (Urology); Last Assessed: 14 Oct 2014  History of Present Illness    81 yo male returns today for a yearly cystoscopy for hx of bladder cancer. He is s/p cysto/TURBT/Mitomycin C instillation on 10/19/13 with pathology showing low grade carcinoma. His wife recently passed away in 03/31/2013. He is s/p cysto/TURBT on 12/12/12 with pathology showing low grade carcinoma. He had no po complications.     Hx of bladder cancer. He is s/p cysto/bladder biopsy & fulgeration on 03/09/12 with pathology showing chronic inflammation only. The patient has a history of recurrent bladder cancer and has had BCG treatments in the past. The patient has had 9 bladder tumor resections. Last TURBT with + pathology was on 11/27/09. He is taking mega vitamins.    Statement of  Likelihood of Success: Excellent. TIME-OUT observed.:  Procedure:  Cystourethroscopy was accomplished under direct vision, and showed no evidence of urethral stricture disease. The prostate was benign, and patent. There was an obvious bladder tumor growing at the bladder neck, measuring 2 cm. This was located just to the  right of the midline.  Further cystoscopy revealed bladder tumor implantation at the bladder dome. This appeared to be sessile, and measured 2 cm.  Because of the thin bladder dome, I elected to use the electrocautery to destroy the bladder tumor at the bladder dome. This was accomplished without difficulty and without bleeding. At bladder neck, elected to resect the bladder tumor, in order to obtain deep biopsy, this was accomplished with the electrical loop. The base was cauterized. Minimal bleeding was noted. Repeat cystoscopy revealed no evidence of any other bladder tumor. The remaining bladder mucosa appeared benign. The trigone was normal. Clear reflux was seen from both ureteral orifices. There was no evidence of bladder stone, or diverticular formation.  The bladder was drained of fluid by placing an 18 French Foley catheter with 10 mL of sterile water in the balloon. The patient was awakened and taken to recovery room in good condition, where he will undergo chemotherapy bladder instillation (mitomycin-C).

## 2014-10-25 NOTE — Interval H&P Note (Signed)
History and Physical Interval Note:  10/25/2014 7:31 AM  Jose Gaines  has presented today for surgery, with the diagnosis of bladder tumor  The various methods of treatment have been discussed with the patient and family. After consideration of risks, benefits and other options for treatment, the patient has consented to  Procedure(s): TRANSURETHRAL RESECTION OF BLADDER TUMOR (TURBT) WITH MITOMYCIN C AND CAUTERIZATION OF TUMOR (N/A) as a surgical intervention .  The patient's history has been reviewed, patient examined, no change in status, stable for surgery.  I have reviewed the patient's chart and labs.  Questions were answered to the patient's satisfaction.     Carolan Clines I

## 2014-10-25 NOTE — Progress Notes (Signed)
Foley draining bloody urine

## 2014-10-25 NOTE — Progress Notes (Signed)
Cath not draining. Patient states he feels like his bladder is about half full. Dr.Tannenbaum notified. Foley irrigated with 60cc sterile water. Several small clots returned. Catheter was easily irrigated and the 60cc water was easily  Withdrawn without problems.

## 2014-10-25 NOTE — Transfer of Care (Signed)
Immediate Anesthesia Transfer of Care Note  Patient: Jose Gaines  Procedure(s) Performed: Procedure(s): Transurethral Resection Biopsy Bladder Tumor,Cauterization Bladder neck and bladder tumor, Cauterization of bladder dome and bladder tumor (N/A)  Patient Location: PACU  Anesthesia Type:General  Level of Consciousness: sedated  Airway & Oxygen Therapy: Patient Spontanous Breathing and Patient connected to face mask oxygen  Post-op Assessment: Report given to RN and Post -op Vital signs reviewed and stable  Post vital signs: Reviewed and stable  Last Vitals:  Filed Vitals:   10/25/14 0507  BP: 158/63  Pulse: 56  Temp: 36.4 C  Resp: 16    Complications: No apparent anesthesia complications

## 2014-10-25 NOTE — Progress Notes (Signed)
Foley connected to leg bag per patient's request. Instructions given to patient and his daughter on how to remove catheter tomorrow AM.

## 2014-10-28 ENCOUNTER — Encounter (HOSPITAL_COMMUNITY): Payer: Self-pay | Admitting: Urology

## 2015-01-30 ENCOUNTER — Other Ambulatory Visit: Payer: Self-pay | Admitting: Urology

## 2015-02-26 ENCOUNTER — Encounter (HOSPITAL_COMMUNITY): Payer: Self-pay | Admitting: *Deleted

## 2015-02-27 ENCOUNTER — Ambulatory Visit (HOSPITAL_COMMUNITY): Payer: Medicare Other | Admitting: Certified Registered"

## 2015-02-27 ENCOUNTER — Encounter (HOSPITAL_COMMUNITY)
Admission: RE | Disposition: A | Discharge status: Home / Self Care | Payer: Self-pay | Source: Ambulatory Visit | Attending: Urology

## 2015-02-27 ENCOUNTER — Ambulatory Visit (HOSPITAL_COMMUNITY)
Admission: RE | Admit: 2015-02-27 | Discharge: 2015-02-27 | Disposition: A | Discharge status: Home / Self Care | Payer: Medicare Other | Source: Ambulatory Visit | Attending: Urology | Admitting: Urology

## 2015-02-27 ENCOUNTER — Encounter (HOSPITAL_COMMUNITY): Payer: Self-pay | Admitting: *Deleted

## 2015-02-27 DIAGNOSIS — I2510 Atherosclerotic heart disease of native coronary artery without angina pectoris: Secondary | ICD-10-CM | POA: Insufficient documentation

## 2015-02-27 DIAGNOSIS — Z8551 Personal history of malignant neoplasm of bladder: Secondary | ICD-10-CM | POA: Diagnosis present

## 2015-02-27 DIAGNOSIS — Z7951 Long term (current) use of inhaled steroids: Secondary | ICD-10-CM | POA: Insufficient documentation

## 2015-02-27 DIAGNOSIS — Z79891 Long term (current) use of opiate analgesic: Secondary | ICD-10-CM | POA: Insufficient documentation

## 2015-02-27 DIAGNOSIS — I10 Essential (primary) hypertension: Secondary | ICD-10-CM | POA: Diagnosis not present

## 2015-02-27 DIAGNOSIS — Z87891 Personal history of nicotine dependence: Secondary | ICD-10-CM | POA: Insufficient documentation

## 2015-02-27 DIAGNOSIS — Z8673 Personal history of transient ischemic attack (TIA), and cerebral infarction without residual deficits: Secondary | ICD-10-CM | POA: Diagnosis not present

## 2015-02-27 DIAGNOSIS — M1990 Unspecified osteoarthritis, unspecified site: Secondary | ICD-10-CM | POA: Insufficient documentation

## 2015-02-27 DIAGNOSIS — Z951 Presence of aortocoronary bypass graft: Secondary | ICD-10-CM | POA: Insufficient documentation

## 2015-02-27 DIAGNOSIS — Z7982 Long term (current) use of aspirin: Secondary | ICD-10-CM | POA: Insufficient documentation

## 2015-02-27 DIAGNOSIS — Z7902 Long term (current) use of antithrombotics/antiplatelets: Secondary | ICD-10-CM | POA: Diagnosis not present

## 2015-02-27 DIAGNOSIS — C672 Malignant neoplasm of lateral wall of bladder: Secondary | ICD-10-CM | POA: Diagnosis not present

## 2015-02-27 DIAGNOSIS — Z79899 Other long term (current) drug therapy: Secondary | ICD-10-CM | POA: Diagnosis not present

## 2015-02-27 HISTORY — PX: CYSTOSCOPY WITH BIOPSY: SHX5122

## 2015-02-27 LAB — BASIC METABOLIC PANEL
ANION GAP: 7 (ref 5–15)
BUN: 17 mg/dL (ref 6–20)
CHLORIDE: 102 mmol/L (ref 101–111)
CO2: 26 mmol/L (ref 22–32)
Calcium: 9 mg/dL (ref 8.9–10.3)
Creatinine, Ser: 0.95 mg/dL (ref 0.61–1.24)
GFR calc Af Amer: >60 mL/min (ref >60)
Glucose, Bld: 92 mg/dL (ref 65–99)
Potassium: 4.6 mmol/L (ref 3.5–5.1)
Sodium: 135 mmol/L (ref 135–145)

## 2015-02-27 LAB — CBC
HEMATOCRIT: 37 % — AB (ref 39.0–52.0)
Hemoglobin: 12.3 g/dL — ABNORMAL LOW (ref 13.0–17.0)
MCH: 30.4 pg (ref 26.0–34.0)
MCHC: 33.2 g/dL (ref 30.0–36.0)
MCV: 91.4 fL (ref 78.0–100.0)
PLATELETS: 164 10*3/uL (ref 150–400)
RBC: 4.05 MIL/uL — ABNORMAL LOW (ref 4.22–5.81)
RDW: 15.2 % (ref 11.5–15.5)
WBC: 8.7 10*3/uL (ref 4.0–10.5)

## 2015-02-27 SURGERY — CYSTOSCOPY, WITH BIOPSY
Anesthesia: General | Site: Bladder

## 2015-02-27 MED ORDER — PROMETHAZINE HCL 25 MG/ML IJ SOLN: 6.2500 mg | INTRAMUSCULAR | Status: DC | PRN

## 2015-02-27 MED ORDER — MITOMYCIN CHEMO FOR BLADDER INSTILLATION 40 MG: 40.0000 mg | Freq: Once | INTRAVENOUS | Status: DC

## 2015-02-27 MED ORDER — MITOMYCIN CHEMO FOR BLADDER INSTILLATION 40 MG
40.0000 mg | Freq: Once | INTRAVENOUS | Status: AC
  Administered 2015-02-27: 40 mg via INTRAVESICAL
  Filled 2015-02-27: qty 40

## 2015-02-27 MED ORDER — FENTANYL CITRATE (PF) 100 MCG/2ML IJ SOLN
25.0000 ug | INTRAMUSCULAR | Status: DC | PRN
  Administered 2015-02-27 (×2): 50 ug via INTRAVENOUS

## 2015-02-27 MED ORDER — LIDOCAINE HCL (CARDIAC) 20 MG/ML IV SOLN
INTRAVENOUS | Status: DC | PRN
  Administered 2015-02-27: 40 mg via INTRAVENOUS

## 2015-02-27 MED ORDER — FENTANYL CITRATE (PF) 100 MCG/2ML IJ SOLN
INTRAMUSCULAR | Status: AC
  Filled 2015-02-27: qty 2

## 2015-02-27 MED ORDER — BELLADONNA ALKALOIDS-OPIUM 16.2-60 MG RE SUPP
RECTAL | Status: AC
  Filled 2015-02-27: qty 1

## 2015-02-27 MED ORDER — ONDANSETRON HCL 4 MG/2ML IJ SOLN
INTRAMUSCULAR | Status: AC
  Filled 2015-02-27: qty 2

## 2015-02-27 MED ORDER — LACTATED RINGERS IV SOLN
INTRAVENOUS | Status: DC
  Administered 2015-02-27: 1000 mL via INTRAVENOUS
  Administered 2015-02-27: 13:00:00 via INTRAVENOUS

## 2015-02-27 MED ORDER — MEPERIDINE HCL 50 MG/ML IJ SOLN
6.2500 mg | INTRAMUSCULAR | Status: DC | PRN
Start: 2015-02-27 — End: 2015-02-27

## 2015-02-27 MED ORDER — FENTANYL CITRATE (PF) 100 MCG/2ML IJ SOLN
INTRAMUSCULAR | Status: DC | PRN
  Administered 2015-02-27 (×2): 50 ug via INTRAVENOUS

## 2015-02-27 MED ORDER — CEFAZOLIN SODIUM-DEXTROSE 2-3 GM-% IV SOLR
2.0000 g | INTRAVENOUS | Status: AC
  Administered 2015-02-27: 2 g via INTRAVENOUS

## 2015-02-27 MED ORDER — STERILE WATER FOR IRRIGATION IR SOLN
Status: DC | PRN
  Administered 2015-02-27 (×2): 3000 mL

## 2015-02-27 MED ORDER — PHENAZOPYRIDINE HCL 200 MG PO TABS: 200.0000 mg | ORAL_TABLET | Freq: Three times a day (TID) | ORAL | Status: AC | PRN

## 2015-02-27 MED ORDER — ACETAMINOPHEN 10 MG/ML IV SOLN
INTRAVENOUS | Status: AC
  Filled 2015-02-27: qty 100

## 2015-02-27 MED ORDER — TRAMADOL-ACETAMINOPHEN 37.5-325 MG PO TABS: 1.0000 | ORAL_TABLET | Freq: Four times a day (QID) | ORAL | Status: AC | PRN

## 2015-02-27 MED ORDER — PROPOFOL 10 MG/ML IV BOLUS
INTRAVENOUS | Status: AC
  Filled 2015-02-27: qty 20

## 2015-02-27 MED ORDER — CEFAZOLIN SODIUM-DEXTROSE 2-3 GM-% IV SOLR
INTRAVENOUS | Status: AC
  Filled 2015-02-27: qty 50

## 2015-02-27 MED ORDER — KETOROLAC TROMETHAMINE 30 MG/ML IJ SOLN
INTRAMUSCULAR | Status: DC | PRN
  Administered 2015-02-27: 30 mg via INTRAVENOUS

## 2015-02-27 MED ORDER — PROPOFOL 10 MG/ML IV BOLUS
INTRAVENOUS | Status: DC | PRN
  Administered 2015-02-27: 150 mg via INTRAVENOUS

## 2015-02-27 MED ORDER — LIDOCAINE HCL (CARDIAC) 20 MG/ML IV SOLN
INTRAVENOUS | Status: AC
  Filled 2015-02-27: qty 5

## 2015-02-27 MED ORDER — PHENAZOPYRIDINE HCL 200 MG PO TABS
200.0000 mg | ORAL_TABLET | Freq: Once | ORAL | Status: AC
  Administered 2015-02-27: 200 mg via ORAL
  Filled 2015-02-27: qty 1

## 2015-02-27 MED ORDER — ACETAMINOPHEN 10 MG/ML IV SOLN
1000.0000 mg | Freq: Once | INTRAVENOUS | Status: AC
  Administered 2015-02-27: 1000 mg via INTRAVENOUS

## 2015-02-27 MED ORDER — LIDOCAINE HCL 2 % EX GEL
CUTANEOUS | Status: AC
  Filled 2015-02-27: qty 10

## 2015-02-27 MED ORDER — ONDANSETRON HCL 4 MG/2ML IJ SOLN
INTRAMUSCULAR | Status: DC | PRN
  Administered 2015-02-27: 4 mg via INTRAVENOUS

## 2015-02-27 SURGICAL SUPPLY — 17 items
BAG URO CATCHER STRL LF (DRAPE) ×2 IMPLANT
CATH ROBINSON RED A/P 14FR (CATHETERS) ×2 IMPLANT
CLOTH BEACON ORANGE TIMEOUT ST (SAFETY) ×2 IMPLANT
DEFLUX NEEDLE (NEEDLE) IMPLANT
DEFLUX SYRINGE (SYRINGE) IMPLANT
GLOVE BIOGEL M STRL SZ7.5 (GLOVE) ×2 IMPLANT
GOWN STRL REUS W/TWL LRG LVL3 (GOWN DISPOSABLE) ×2 IMPLANT
GOWN STRL REUS W/TWL XL LVL3 (GOWN DISPOSABLE) ×2 IMPLANT
MANIFOLD NEPTUNE II (INSTRUMENTS) ×2 IMPLANT
NDL SAFETY ECLIPSE 18X1.5 (NEEDLE) ×1 IMPLANT
NEEDLE HYPO 18GX1.5 SHARP (NEEDLE) ×1
NEEDLE SPNL 22GX7 QUINCKE BK (NEEDLE) ×2 IMPLANT
PACK CYSTO (CUSTOM PROCEDURE TRAY) ×2 IMPLANT
SCRUB PCMX 4 OZ (MISCELLANEOUS) ×2 IMPLANT
SYR CONTROL 10ML LL (SYRINGE) ×2 IMPLANT
TUBING CONNECTING 10 (TUBING) ×2 IMPLANT
WATER STERILE IRR 3000ML UROMA (IV SOLUTION) ×2 IMPLANT

## 2015-02-27 NOTE — Op Note (Signed)
Pre-operative diagnosis : Recurrent TaNxMx TCC bladder  Postoperative diagnosis:  Same  Operation:  Cystourethroscopy, biopsy excision of recurrent multiple bladder tumors and bladder dome and right lateral wall, cauterization of biopsy sites; instillation of mitomycin C and recovery room  Surgeon:  S. Gaynelle Arabian, MD  First assistant:  None  Anesthesia:  LMA (significant belly breathing)  Preparation:  After appropriate pre-anesthesia, the patient was brought the operating, placed on the operating table in the dorsal supine position where general LMA anesthesia was introduced. He was then replaced in dorsal lithotomy position with the pubis was prepped with Betadine solution and draped in usual fashion. The arm and was double checked. The history was double checked.  Review history:  Active Problems Problems  1. 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 He is s/p cysto/TURBT/Mitomycin C instillation on 10/25/14. Hx of bladder cancer. He is s/p cysto/TURBT/Mitomycin C instillation on 10/19/13 with pathology showing low grade carcinoma. His wife recently passed away in Apr 08, 2013. He is s/p cysto/TURBT on 12/12/12 with pathology showing low grade carcinoma. He had no po complications.     Hx of bladder cancer. He is s/p cysto/bladder biopsy & fulguration on 03/09/12 with pathology showing chronic inflammation only. The patient has a history of recurrent bladder cancer and has had BCG treatments in the past. The patient has had 9 bladder tumor resections. Last TURBT with + pathology was on 11/27/09. He is taking mega vitamins.    Statement of  Likelihood of Success: Excellent. TIME-OUT observed.:  Procedure:  Cystourethroscopy was accomplished using a 0 lens, and the Tauber biopsy  instrument, excisional biopsies were obtained from the bladder dome, and the right lateral wall. Recurrent sessile bladder tumor was identified under the bladder dome air bubble, requiring manipulation the bladder to move the tumor into position. In addition, extensive cauterization on the right side of the wall was accomplished, because bladder cancer was identified as well.  5 bleeding was noted. The patient had an 23 French Foley catheter placed with 15 mL in the balloon. He will have mitomycin C placed in recovery room. He was awakened, and taken to recovery room in good condition.

## 2015-02-27 NOTE — Anesthesia Preprocedure Evaluation (Addendum)
Anesthesia Evaluation  Patient identified by MRN, date of birth, ID band Patient awake    Reviewed: Allergy & Precautions, NPO status , Patient's Chart, lab work & pertinent test results  Airway Mallampati: II  TM Distance: >3 FB Neck ROM: Full    Dental no notable dental hx.    Pulmonary former smoker,  breath sounds clear to auscultation  Pulmonary exam normal       Cardiovascular hypertension, Pt. on medications + CAD and + CABG Normal cardiovascular examRhythm:Regular Rate:Normal     Neuro/Psych negative neurological ROS  negative psych ROS   GI/Hepatic negative GI ROS, Neg liver ROS,   Endo/Other  negative endocrine ROS  Renal/GU negative Renal ROS  negative genitourinary   Musculoskeletal negative musculoskeletal ROS (+)   Abdominal   Peds negative pediatric ROS (+)  Hematology negative hematology ROS (+)   Anesthesia Other Findings   Reproductive/Obstetrics negative OB ROS                            Anesthesia Physical Anesthesia Plan  ASA: III  Anesthesia Plan: General   Post-op Pain Management:    Induction: Intravenous  Airway Management Planned: LMA  Additional Equipment:   Intra-op Plan:   Post-operative Plan: Extubation in OR  Informed Consent: I have reviewed the patients History and Physical, chart, labs and discussed the procedure including the risks, benefits and alternatives for the proposed anesthesia with the patient or authorized representative who has indicated his/her understanding and acceptance.   Dental advisory given  Plan Discussed with: CRNA  Anesthesia Plan Comments:         Anesthesia Quick Evaluation

## 2015-02-27 NOTE — Progress Notes (Signed)
Dr. Marcell Barlow made aware of patient's blood pressures- O.K. To go to Short Stay

## 2015-02-27 NOTE — Transfer of Care (Signed)
Immediate Anesthesia Transfer of Care Note  Patient: Jose Gaines  Procedure(s) Performed: Procedure(s) with comments: CYSTOSCOPY WITH BIOPSY BLADDER TUMOR INSTILLATION MITOMYCIN C  (N/A) - Mitomycin C in PACU  Patient Location: PACU  Anesthesia Type:General  Level of Consciousness:  sedated, patient cooperative and responds to stimulation  Airway & Oxygen Therapy:Patient Spontanous Breathing and Patient connected to face mask oxgen  Post-op Assessment:  Report given to PACU RN and Post -op Vital signs reviewed and stable  Post vital signs:  Reviewed and stable  Last Vitals:  Filed Vitals:   02/27/15 0712  BP:   Pulse: 54  Temp:   Resp:     Complications: No apparent anesthesia complications

## 2015-02-27 NOTE — Progress Notes (Signed)
Dr. Marcell Barlow made aware of patient's heart rates- as low as 43- also blood pressures

## 2015-02-27 NOTE — Progress Notes (Signed)
Dr. Gaynelle Arabian instilled Mitomycin in to patient's bladder- patient tolerated procedure well- Foley catheter plugged - to be plugged x 40 minutes- then drained- and discontinued.

## 2015-02-27 NOTE — Discharge Instructions (Signed)
Bladder Cancer Bladder cancer is an abnormal growth of tissue in your bladder. Your bladder is the balloon-like sac in your pelvis. It collects and stores urine that comes from the kidneys through the ureters. The bladder wall is made of layers. If cancer spreads into these layers and through the wall of the bladder, it becomes more difficult to treat.  There are four stages of bladder cancer:  Stage I. Cancer at this stage occurs in the bladder's inner lining but has not invaded the muscular bladder wall.  Stage II. At this stage, cancer has invaded the bladder wall but is still confined to the bladder.  Stage III. By this stage, the cancer cells have spread through the bladder wall to surrounding tissue. They may also have spread to the prostate in men or the uterus or vagina in women.  Stage IV. By this stage, cancer cells may have spread to the lymph nodes and other organs, such as your lungs, bones, or liver. RISK FACTORS Although the cause of bladder cancer is not known, the following risk factors can increase your chances of getting bladder cancer:   Smoking.   Occupational exposures, such as rubber, leather, textile, dyes, chemicals, and paint.  Being white.  Age.   Being male.   Having chronic bladder inflammation.   Having a bladder cancer history.   Having a family history of bladder cancer (heredity).   Having had chemotherapy or radiation therapy to the pelvis.   Being exposed to arsenic.  SYMPTOMS   Blood in the urine.   Pain with urination.   Frequent bladder or urine infections.  Increase in urgency and frequency of urination. DIAGNOSIS  Your health care provider may suspect bladder cancer based on your description of urinary symptoms or based on the finding of blood or infection in the urine (especially if this has recurred several times). Other tests or procedures that may be performed include:   A narrow tube being inserted into your bladder  through your urethra (cystoscopy) in order to view the lining of your bladder for tumors.   A biopsy to sample the tumor to see if cancer is present.  If cancer is present, it will then be staged to determine its severity and extent. It is important to know how deeply into the bladder wall the cancer has grown and whether the cancer has spread to any other parts of your body. Staging may require blood tests or special scans such as a CT scan, MRI, bone scan, or chest X-ray.  TREATMENT  Once your cancer has been diagnosed and staged, you should discuss a treatment plan with your health care provider. Based on the stage of the cancer, one treatment or a combination of treatments may be recommended. The most common forms of treatment are:   Surgery. Procedures that may be done include transurethral resection and cystectomy.  Radiation therapy. This is infrequently used to treat bladder cancer.   Chemotherapy. During this treatment, drugs are used to kill cancer cells.  Immunotherapy. This is usually administered directly into the bladder. HOME CARE INSTRUCTIONS  Take medicines only as directed by your health care provider.   Maintain a healthy diet.   Consider joining a support group. This may help you learn to cope with the stress of having bladder cancer.   Seek advice to help you manage treatment side effects.   Keep all follow-up visits as directed by your health care provider.   Inform your cancer specialist if you are  stress of having bladder cancer.    · Seek advice to help you manage treatment side effects.    · Keep all follow-up visits as directed by your health care provider.    · Inform your cancer specialist if you are admitted to the hospital.    SEEK MEDICAL CARE IF:  · There is blood in your urine.  · You have symptoms of a urinary tract infection. These include:  ¨ Tiredness.  ¨ Shakiness.  ¨ Weakness.  ¨ Muscle aches.  ¨ Abdominal pain.  ¨ Frequent and intense urge to urinate (in young women).  ¨ Burning feeling in the bladder or urethra during urination (in young women).  SEEK IMMEDIATE MEDICAL CARE IF:   You are unable to urinate.  Document Released: 09/09/2003 Document  Revised: 01/21/2014 Document Reviewed: 02/27/2013  [employer]® Patient Information ©2015 [employer]. This information is not intended to replace advice given to you by your health care provider. Make sure you discuss any questions you have with your health care provider.

## 2015-02-27 NOTE — Interval H&P Note (Signed)
History and Physical Interval Note:  02/27/2015 9:12 AM  Jose Gaines [name]  has presented today for surgery, with the diagnosis of BLADDER CANCER  The various methods of treatment have been discussed with the patient and family. After consideration of risks, benefits and other options for treatment, the patient has consented to  Procedure(s): CYSTOSCOPY WITH BIOPSY BLADDER TUMOR INSTILLATION MITOMYCIN C  (N/A) as a surgical intervention .  The patient's history has been reviewed, patient examined, no change in status, stable for surgery.  I have reviewed the patient's chart and labs.  Questions were answered to the patient's satisfaction.     Jace Dowe I Daryle Boyington

## 2015-02-27 NOTE — Anesthesia Procedure Notes (Signed)
Procedure Name: LMA Insertion Date/Time: 02/27/2015 9:52 AM Performed by: Lajuana Carry E Pre-anesthesia Checklist: Patient identified, Emergency Drugs available, Suction available and Patient being monitored Patient Re-evaluated:Patient Re-evaluated prior to inductionOxygen Delivery Method: Circle system utilized Preoxygenation: Pre-oxygenation with 100% oxygen Intubation Type: IV induction Ventilation: Mask ventilation without difficulty LMA: LMA inserted LMA Size: 4.0 Number of attempts: 1 Placement Confirmation: positive ETCO2 and breath sounds checked- equal and bilateral Dental Injury: Teeth and Oropharynx as per pre-operative assessment

## 2015-02-27 NOTE — Progress Notes (Signed)
Foley catheter discontinued without difficulty- 100 cc of urine in bag- red-colored.

## 2015-02-27 NOTE — Anesthesia Postprocedure Evaluation (Signed)
  Anesthesia Post-op Note  Patient: Jose Gaines  Procedure(s) Performed: Procedure(s) (LRB): CYSTOSCOPY WITH BIOPSY BLADDER TUMOR INSTILLATION MITOMYCIN C  (N/A)  Patient Location: PACU  Anesthesia Type: General  Level of Consciousness: awake and alert   Airway and Oxygen Therapy: Patient Spontanous Breathing  Post-op Pain: mild  Post-op Assessment: Post-op Vital signs reviewed, Patient's Cardiovascular Status Stable, Respiratory Function Stable, Patent Airway and No signs of Nausea or vomiting  Last Vitals:  Filed Vitals:   02/27/15 1340  BP: 162/56  Pulse: 45  Temp:   Resp: 18    Post-op Vital Signs: stable   Complications: No apparent anesthesia complications

## 2015-02-27 NOTE — Progress Notes (Signed)
Foley catheter connected to straight drainage and drained

## 2015-02-27 NOTE — Progress Notes (Signed)
Reviewed post procedure instructions with patient and his family. Mitomycin precautions. Patient stated he has had this done before and knows all about that.

## 2015-02-27 NOTE — H&P (Signed)
Reason For Visit 3 mo cystoscopy   Active Problems Problems  1. 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 He is s/p cysto/TURBT/Mitomycin C instillation on 10/25/14. Hx of bladder cancer. He is s/p cysto/TURBT/Mitomycin C instillation on 10/19/13 with pathology showing low grade carcinoma. His wife recently passed away in 16-Apr-2013.  He is s/p cysto/TURBT on 12/12/12 with pathology showing low grade carcinoma. He had no po complications.      Hx of bladder cancer. He is s/p cysto/bladder biopsy & fulguration on 03/09/12 with pathology showing chronic inflammation only. The patient has a history of recurrent bladder cancer and has had BCG treatments in the past. The patient has had 9 bladder tumor resections. Last TURBT with + pathology was on 11/27/09. He is taking mega vitamins.   Past Medical History Problems  1. History of Arthritis 2. History of Coronary Artery Disease 3. Former smoker [phone]) 4. Former smoker [phone]) 5. History of cardiac disorder (Z86.79) 6. History of hypertension (Z86.79) 7. History of transient cerebral ischemia (O70.96)  Surgical History Problems  1. History of Bladder Injection Of Cancer Treatment 2. History of Bladder Injection Of Cancer Treatment 3. History of CABG (CABG) 4. History of Cholecystectomy 5. History of Cystoscopy Bladder Tumor 6. History of Cystoscopy With Biopsy 7. History of Cystoscopy With Biopsy 8. History of Cystoscopy With Fulguration Medium Lesion (2-5cm) 9. History of Cystoscopy With Fulguration Small Lesion (5-35mm) 10. History of Cystoscopy With Fulguration Small Lesion (5-53mm) 11. History of Cystoscopy With Fulguration Small Lesion (5-82mm)  Current Meds 1. Advair Diskus 250-50 MCG/DOSE Inhalation Aerosol Powder  Breath  Activated;  Therapy: [id number] to Recorded 2. Aspirin 81 MG TABS;  Therapy: (Recorded:23Apr2013) to Recorded 3. DULoxetine HCl - 30 MG Oral Capsule Delayed Release Particles; Take 1  capsule daily for 1 week.  Then take 1 capsule BID;  Therapy: [id number] to (Evaluate:22May2016)  Requested for:  22Feb2016; Last Rx:22Feb2016 Ordered 4. Hydrocodone-Acetaminophen 7.5-650 MG TABS;  Therapy: 20Jun2013 to Recorded 5. Levothyroxine Sodium 75 MCG Oral Tablet;  Therapy: 13Apr2013 to Recorded 6. Lipitor 40 MG Oral Tablet;  Therapy: 65KCL2751 to Recorded 7. Pantoprazole Sodium 40 MG Oral Tablet Delayed Release;  Therapy: 70YFV4944 to Recorded 8. Plavix 75 MG Oral Tablet;  Therapy: (Recorded:25Nov2009) to Recorded 9. Rapaflo 8 MG Oral Capsule; TAKE 1 CAPSULE DAILY WITH FOOD;  Therapy: [id number] to (Evaluate:06Feb2017)  Requested for: [id number];  Last Rx:12Feb2016 Ordered 10. Uribel 118 MG Oral Capsule; TAKE 1 CAPSULE 3 times daily;   Therapy: [id number] to (Last Rx:16Feb2015)  Requested for: [id number]   Ordered 11. Zestril 40 MG Oral Tablet;   Therapy: (Recorded:11Jun2008) to Recorded  Allergies Medication  1. Sulfa Drugs  Social History Problems  1. Marital History - Currently Married 2. Occupation:   ENGINEER  Review of Systems Genitourinary, constitutional, skin, eye, otolaryngeal, hematologic/lymphatic, cardiovascular, pulmonary, endocrine, musculoskeletal, gastrointestinal, neurological and psychiatric system(s) were reviewed and pertinent findings if present are noted and are otherwise negative.    Vitals Vital Signs [Data Includes: Last 1 Day]  Recorded: [id number] 09:35AM  Blood Pressure: 148 / 65 Temperature: 96.9 F Heart Rate: 74  Physical Exam Constitutional: Well nourished and well developed . No acute distress.  ENT:. The ears and nose are normal in appearance.  Neck: The  appearance of the neck is normal and no neck mass is present.  Pulmonary: No  respiratory distress and normal respiratory rhythm and effort.  Cardiovascular:. No peripheral edema.  Abdomen: The abdomen is rounded. The abdomen is soft and nontender. No masses are palpated. No CVA tenderness. No hernias are palpable. No hepatosplenomegaly noted.  Genitourinary: Examination of the penis demonstrates no discharge, no masses, no lesions and a normal meatus. The scrotum is without lesions. The right epididymis is palpably normal and non-tender. The left epididymis is palpably normal and non-tender. The right testis is non-tender and without masses. The left testis is non-tender and without masses.  Lymphatics: The femoral and inguinal nodes are not enlarged or tender.  Skin: Normal skin turgor, no visible rash and no visible skin lesions.  Neuro/Psych:. Mood and affect are appropriate.    Results/Data  28 Jan 2015 9:24 AM  UA With REFLEX    COLOR STRAW     APPEARANCE CLEAR     SPECIFIC GRAVITY 1.020     pH 5.5     GLUCOSE NEG     BILIRUBIN NEG     KETONE NEG     BLOOD NEG     PROTEIN NEG     UROBILINOGEN 0.2     NITRITE NEG     LEUKOCYTE ESTERASE NEG     Procedure  Procedure: Cystoscopy  Chaperone Present: kim lewis.  Indication: History of Urothelial Carcinoma.  Informed Consent: Risks, benefits, and potential adverse events were discussed and informed consent was obtained from the patient.  Prep: The patient was prepped with betadine.  Anesthesia:. Local anesthesia was administered intraurethrally with 2% lidocaine jelly.  Antibiotic prophylaxis: Ciprofloxacin.  Procedure Note:  Urethral meatus:. No abnormalities.  Anterior urethra: No abnormalities.  Prostatic urethra: No abnormalities.  Bladder: Visulization was clear. The ureteral orifices were in the normal anatomic position bilaterally. Examination of the bladder demonstrated no clot within the bladder, no trabeculation and no diverticulum no cellules. Multiple tumors were identified in the bladder. A  papillary tumor was seen in the bladder measuring approximately 2 cm in size. This tumor was located toward the midline, near the dome of the bladder. Another sessile tumor was seen in the bladder measuring approximately 2 cm in size. This tumor was located on the right side, on the posterior aspect of the bladder. The patient tolerated the procedure well.  Complications: None.    Assessment Assessed  1. 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 He has a hx of Ta lesions, and has been treated in the past with TUR-BT/Mitomycin-C; and BCG. He did not tolerate addition of immunotherapy. He lives in [place], and this would make Rx with Mitomycin C difficult.   Plan Primary transitional cell carcinoma of bladder  1. Follow-up Schedule Surgery Office  Follow-up  Status: Hold For -  Appointment  Requested for: [id number]  Cold-cup bx and removal of recurrent bladder tumors.   Signatures Electronically signed by : Carolan Clines, M.D.; Jan 28 2015  6:14PM EST

## 2015-02-28 ENCOUNTER — Encounter (HOSPITAL_COMMUNITY): Payer: Self-pay | Admitting: Urology

## 2015-05-29 IMAGING — CR DG CHEST 2V
2 series · 2 of 2 positions shown · non-contrast
Comparison: 12/12/2012.

CLINICAL DATA: Bladder surgery.  Hypertension.

EXAM:
CHEST  2 VIEW

[w chest pa]
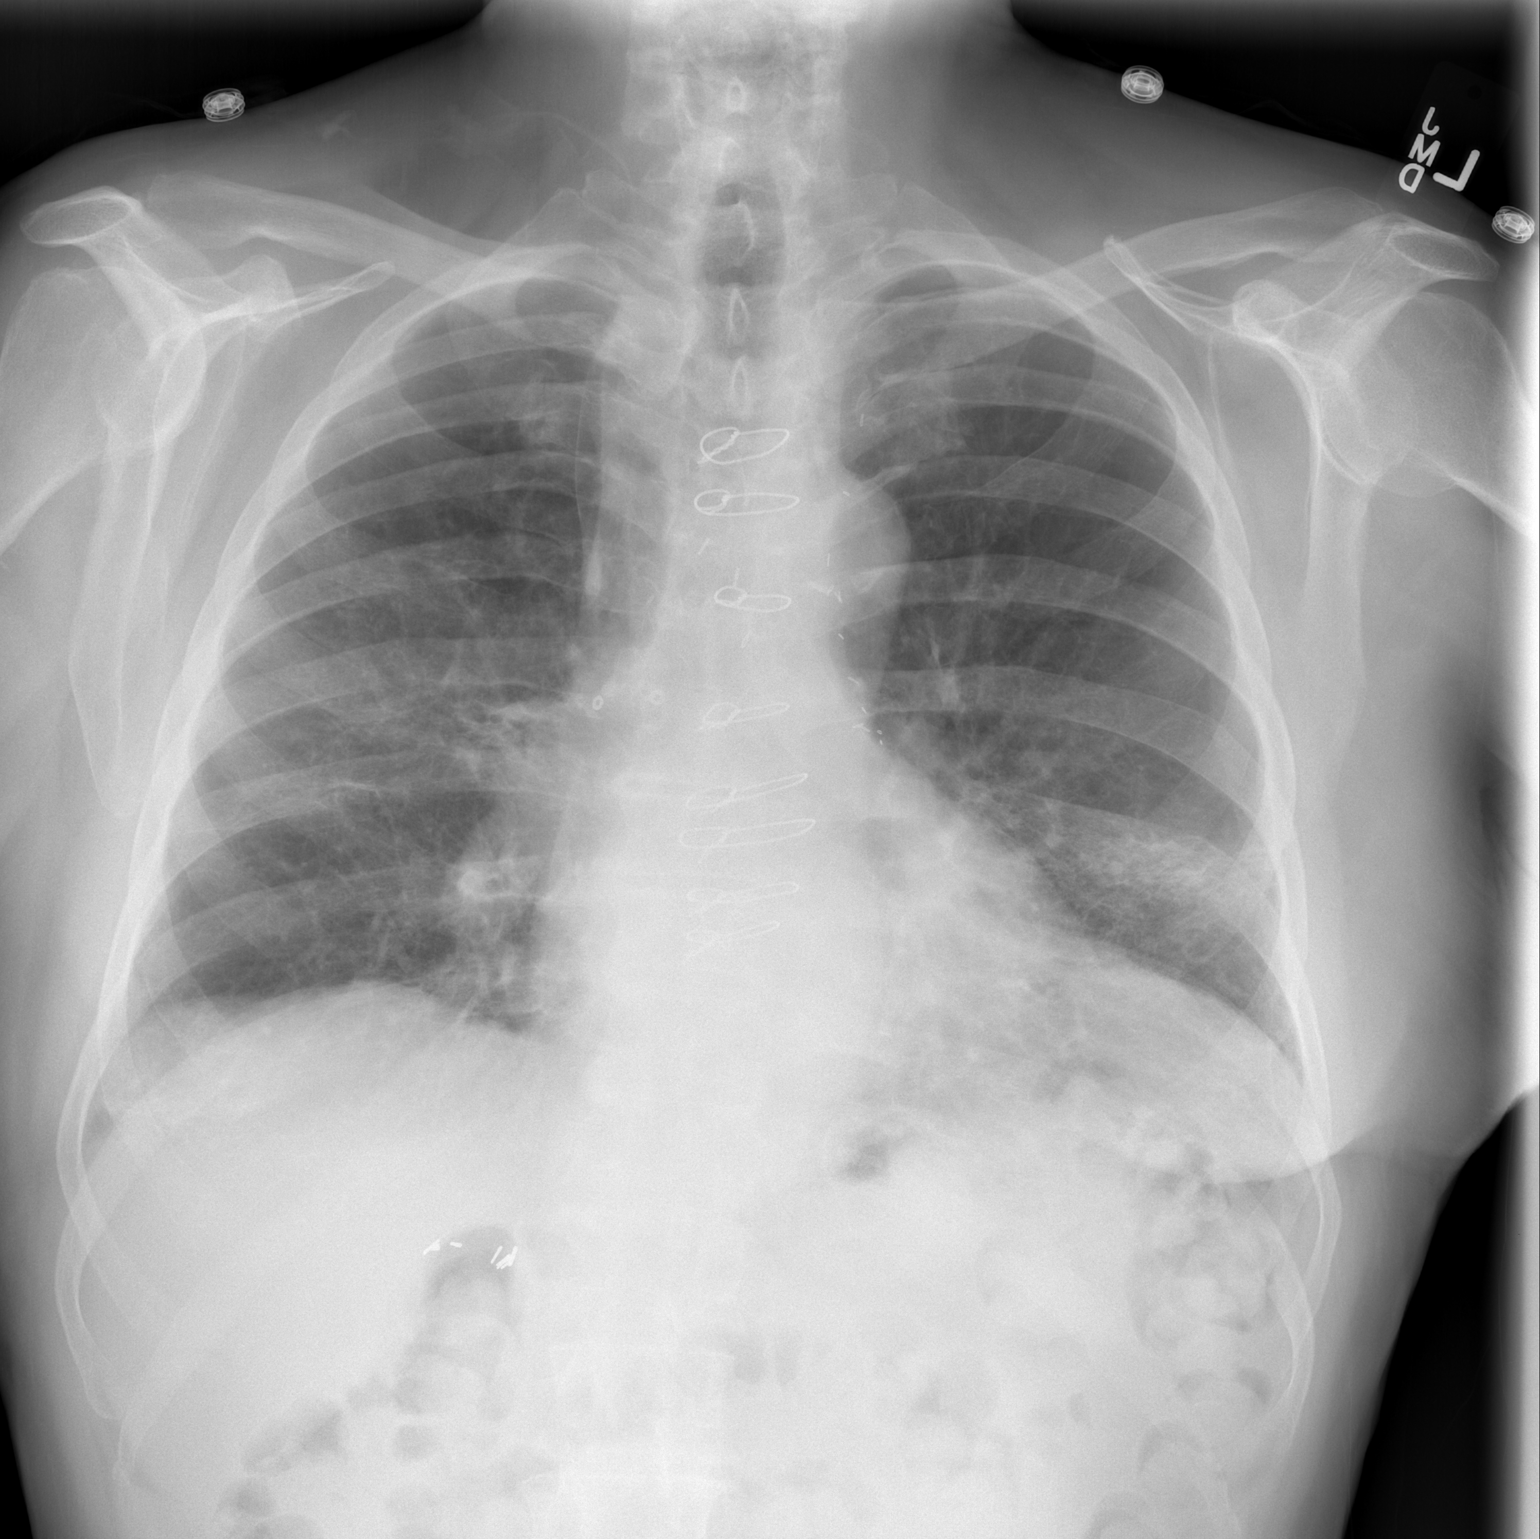

[w chest lat]
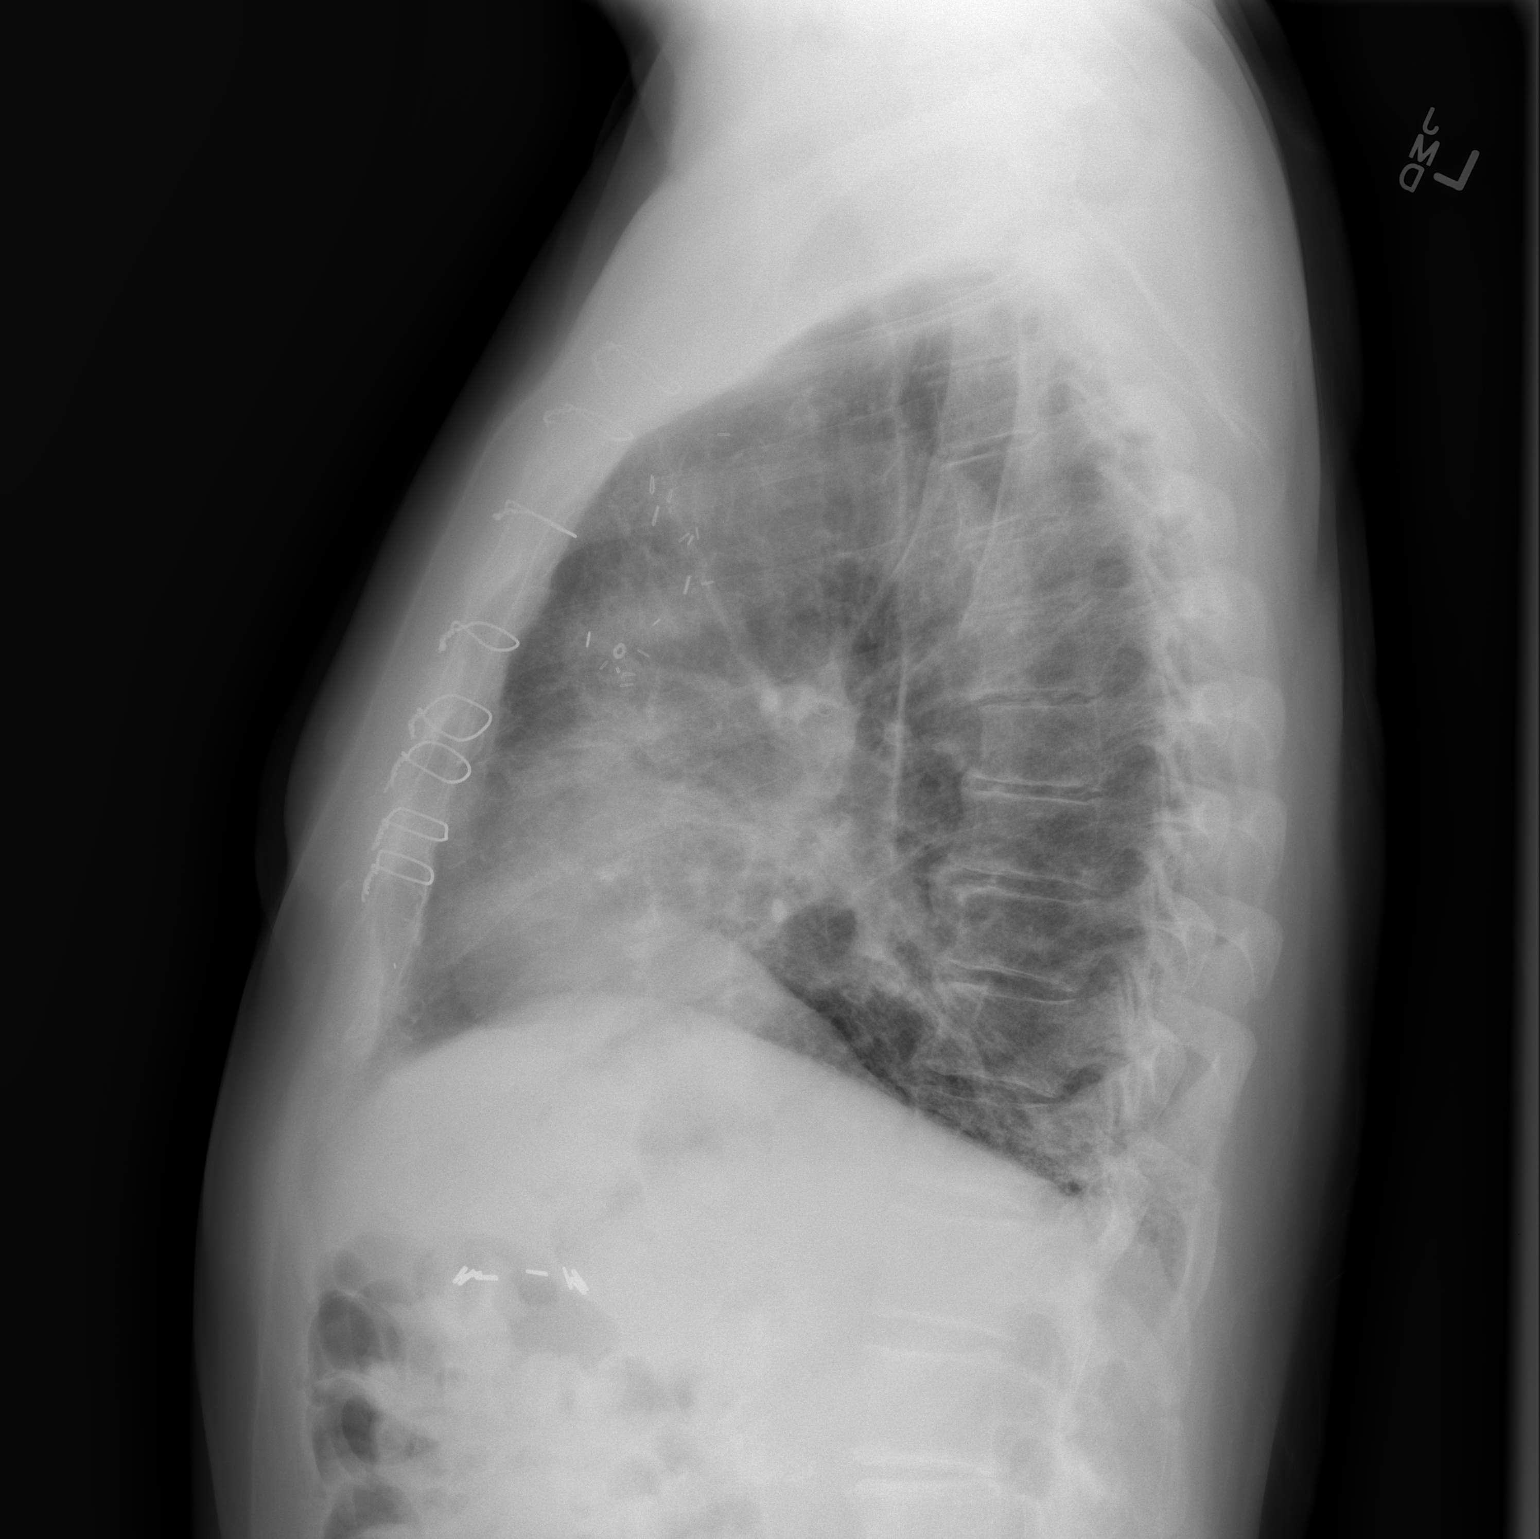

[2 of 2 positions shown; findings below may reference images not displayed]

FINDINGS: Mediastinum hilar structures are normal. Bibasilar pleural
parenchymal thickening again noted most consistent with scarring.
Mild pneumonitis in the left lung base cannot be excluded. No
pleural effusion or pneumothorax. Prior CABG. Heart size stable. No
pulmonary venous congestion. Surgical clips right upper quadrant.
Degenerative changes thoracic spine.
IMPRESSION: 1. Bibasilar pleural parenchymal thickening again noted consistent
with scarring. Mild pneumonitis in the left lung base cannot be
excluded.

2.  Prior CABG.  Heart size stable.  No CHF.

## 2021-10-21 DEATH — deceased
